# Patient Record
Sex: Female | Born: 1995
Health system: Southern US, Community
[De-identification: ages and names within clinical notes are randomized; demographics above are authoritative.]

## PROBLEM LIST (undated history)

## (undated) DIAGNOSIS — T7840XA Allergy, unspecified, initial encounter: Secondary | ICD-10-CM

## (undated) DIAGNOSIS — Z8709 Personal history of other diseases of the respiratory system: Secondary | ICD-10-CM

## (undated) HISTORY — DX: Allergy, unspecified, initial encounter: T78.40XA

## (undated) HISTORY — DX: Personal history of other diseases of the respiratory system: Z87.09

## (undated) HISTORY — PX: BUNIONECTOMY: SHX129

---
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
## 2014-12-02 ENCOUNTER — Ambulatory Visit (INDEPENDENT_AMBULATORY_CARE_PROVIDER_SITE_OTHER): Payer: BLUE CROSS/BLUE SHIELD

## 2014-12-02 ENCOUNTER — Encounter: Payer: Self-pay | Admitting: Podiatry

## 2014-12-02 ENCOUNTER — Ambulatory Visit (INDEPENDENT_AMBULATORY_CARE_PROVIDER_SITE_OTHER): Payer: BLUE CROSS/BLUE SHIELD | Admitting: Podiatry

## 2014-12-02 VITALS — BP 98/56 | HR 84 | Resp 16 | Ht 63.5 in | Wt 148.0 lb

## 2014-12-02 DIAGNOSIS — M2580 Other specified joint disorders, unspecified joint: Secondary | ICD-10-CM | POA: Diagnosis not present

## 2014-12-02 DIAGNOSIS — M79672 Pain in left foot: Secondary | ICD-10-CM

## 2014-12-02 MED ORDER — METHYLPREDNISOLONE 4 MG PO TBPK: ORAL_TABLET | ORAL | Status: DC

## 2014-12-02 NOTE — Patient Instructions (Signed)
Sesamoid Injury  Sesamoid bones are bones that are completely enclosed by a tendon. The most recognizable sesamoid bone is the kneecap (patella). Your body also has sesamoid bones in the hands and feet. Sesamoid bones of the feet are more commonly injured than those of the hand. Sesamoid bones in the feet may be injured because of the force placed on them while standing, walking, running, or jumping. Sesamoid injuries include:   Inflammation of the sesamoid (sesamoiditis).  Fracture.  Stress fracture. The sesamoid bone on the base of the big toe is especially susceptible. SYMPTOMS   Pain with weight bearing on the foot, such as with standing, walking, running, jumping, or dancing.  Pain with trying to lift the big toe.  Tenderness and swelling under the base of the big toe. CAUSES  A sesamoid injury is typically caused by acute trauma or overuse trauma to the foot. This may include jumping and landing on the ball of the foot or jumping or dancing on the balls of the feet. Other causes include:  Interrupted blood supply (avascular necrosis).  Infection. RISK INCREASES WITH:  Sports that require jumping from a great height or repeated jumping or standing on the balls of the feet. These include:  Basketball.  Ballet.  Jogging.  Long-distance running.  Shoes that are too small or have very high heels.  Large or poorly shaped sesamoid bone.  Bunions. PREVENTION  Warm up and stretch properly before activity.  Maintain appropriate conditioning:  Ankle and leg flexibility.  Muscle strength and endurance.  Learn and use proper technique and have a coach correct improper technique.  Wear taping, protective strapping, bracing, or padding.  Wear shoes that are the proper size and ensure correct fit. PROGNOSIS If detected early and treated properly, sesamoid injuries are usually curable within 4 to 6 months.  RELATED COMPLICATIONS   Prolonged healing time if not  appropriately treated or if not given enough time to heal.  Fracture does not heal (nonunion).  Prolonged disability.  Frequent recurrence of symptoms. Appropriately addressing the problem with rehabilitation decreases frequency of recurrence and optimizes healing time.  Arthritis of the joint between the sesamoid and the rest of the big toe.  Complications of surgery, including infection, bleeding, injury to nerves, continued pain, bunion or reverse bunion formation, toe weakness, and toe hyperextension. TREATMENT Treatment initially involves the use of ice and medication to reduce pain and inflammation. It may be recommended for you to modify your activities, so they do not cause an increase in the severity of symptoms. Depending on the severity of the injury, you may be required to use crutches in order to keep weight off of the injury. Padding, bracing, or taping the area may help reduce pain. Casting of the leg and foot, a walking boot, or a stiff-soled shoe (with or without an arch support) may also be helpful. For cases of chronic sesamoid symptoms, the use of physical therapy may be recommended. On occasion, corticosteroid injections are given to reduce inflammation. It is uncommon, but possible, for surgery to be necessary to remove the sesamoid bone. MEDICATION   If pain medication is necessary, nonsteroidal anti-inflammatory medications such as aspirin and ibuprofen or other minor pain relievers such as acetaminophen are often recommended.  Do not take pain medication for 7 days before surgery.  Prescription pain relievers are usually only prescribed after surgery. Use only as directed and only as much as you need.  Corticosteroid injections may be given to reduce inflammation. However, these  injections may only be given a certain number of times. HEAT AND COLD Cold treatment (icing) relieves pain and reduces inflammation. Cold treatment should be applied for 10 to 15 minutes every  2 to 3 hours for inflammation and pain and immediately after any activity that aggravates your symptoms. Use ice packs or massage the area with a piece of ice (ice massage). SEEK MEDICAL CARE IF:   Symptoms get worse or do not improve in 6 weeks despite treatment.  Any signs of infection develop, including fever, headaches, muscular aches and weakness, fatigue, redness, warmth, or increased swelling or pain.  Any of the following occur after surgery:  You experience pain, numbness, or coldness in the foot and ankle.  Blue, gray, or dark color appears in the toenails.  Signs of infection develop, including fever, increased pain, swelling, redness, drainage, or bleeding in the surgical area.  New, unexplained symptoms develop (drugs used in treatment may produce side effects including bleeding, stomach upset, and allergic reactions). Document Released: 04/12/2005 Document Revised: 07/05/2011 Document Reviewed: 07/25/2008 [facility] Patient Information 2015 [place], [place]. This information is not intended to replace advice given to you by your health care provider. Make sure you discuss any questions you have with your health care provider.

## 2014-12-02 NOTE — Progress Notes (Signed)
   Subjective:    Patient ID: Caitlin Roberts, female    DOB: [birth year], 19 y.o.   MRN: [id number]  HPI Comments: "I am having some pain on the foot my surgery was done on"  Patient c/o aching, cramping sub 1st MPJ left for several days. The area gets red and swollen. She has had previous bunion surgery. She stands a lot at work. She has tried soaking in epsom salt water-temp relieves pain.  Foot Pain   she attends [employer] pursuing a major in physical therapy.    Review of Systems  All other systems reviewed and are negative.      Objective:   Physical Exam: I have reviewed her past medical history medications allergies surgery social history review of systems and chief complaint with statement. Pulses are strongly palpable bilateral. Neurologic sensorium is intact per Semmes-Weinstein monofilament. The tendon reflexes are intact bilateral muscle strength +5 over 5 dorsiflexion plantar flexors and inverters everters all into the musculature is intact. Orthopedic evaluation demonstrates all joints distal ankle range of motion with exception of the first metatarsal phalangeal joint of the left foot. The toe has very good dorsiflexion however can only reach neutral for its plantar flexion. It has 0 of plantarflexion. She has pain on palpation of her sesamoids. Radiograph demonstrates a clear joint with a single K wire status post first metatarsal osteotomy.        Assessment & Plan:  Assessment: Sesamoiditis capsulitis status post bunion repair left foot.  Plan: We discussed surgical intervention including relieving the restrictions on the sesamoids. We also discussed removing the K wire. At this point we injected with Kenalog and local anesthesia to the point of maximal tenderness plantarly and we discussed the possible need for orthotics to help offload her first metatarsal. I will follow-up with her 1 month I also placed her on methylprednisolone and I will follow-up with her with  questions or concerns.

## 2014-12-05 ENCOUNTER — Telehealth: Payer: Self-pay | Admitting: Family Medicine

## 2014-12-05 NOTE — Telephone Encounter (Signed)
If she has Family Planning Medicaid only then she can only have a CPE with discussion regarding birthcontrol with me and no other issues can be addressed. I will do a CPE visit on the first visit for this reason. Thank you.

## 2014-12-05 NOTE — Telephone Encounter (Signed)
This is a patient who use to see Dr Ancil Boozer and transferred earlier this year and was last seen on 12-07-13. She is now establishing care with you at the end of the month. The mother was wanting her to be seen for a physical. I informed the mother that you do not do physicals on the first visit. She informed me that her daughter was healthy and all she needed was a physical. She took the appointment but when i ran her insurance card it does not have another doctor office on it, but it does say that she has family planning. Are you willing to see her for just a physical on that day or keep her in for a establish care appointment.

## 2014-12-06 NOTE — Telephone Encounter (Signed)
Thank you patient was informed and appointment was made for annual CPE/Est care.

## 2014-12-24 ENCOUNTER — Ambulatory Visit (INDEPENDENT_AMBULATORY_CARE_PROVIDER_SITE_OTHER): Payer: BLUE CROSS/BLUE SHIELD | Admitting: Family Medicine

## 2014-12-24 ENCOUNTER — Ambulatory Visit: Payer: Self-pay | Admitting: Family Medicine

## 2014-12-24 ENCOUNTER — Encounter: Payer: Self-pay | Admitting: Family Medicine

## 2014-12-24 VITALS — BP 102/60 | HR 99 | Temp 99.0°F | Resp 18 | Wt 145.4 lb

## 2014-12-24 DIAGNOSIS — L989 Disorder of the skin and subcutaneous tissue, unspecified: Secondary | ICD-10-CM | POA: Insufficient documentation

## 2014-12-24 DIAGNOSIS — Z0000 Encounter for general adult medical examination without abnormal findings: Secondary | ICD-10-CM | POA: Insufficient documentation

## 2014-12-24 DIAGNOSIS — H029 Unspecified disorder of eyelid: Secondary | ICD-10-CM | POA: Insufficient documentation

## 2014-12-24 DIAGNOSIS — Z2821 Immunization not carried out because of patient refusal: Secondary | ICD-10-CM | POA: Insufficient documentation

## 2014-12-24 DIAGNOSIS — G4489 Other headache syndrome: Secondary | ICD-10-CM | POA: Insufficient documentation

## 2014-12-24 DIAGNOSIS — G44009 Cluster headache syndrome, unspecified, not intractable: Secondary | ICD-10-CM

## 2014-12-24 DIAGNOSIS — M21612 Bunion of left foot: Secondary | ICD-10-CM | POA: Insufficient documentation

## 2014-12-24 DIAGNOSIS — M21611 Bunion of right foot: Secondary | ICD-10-CM | POA: Insufficient documentation

## 2014-12-24 MED ORDER — IBUPROFEN 800 MG PO TABS: 800.0000 mg | ORAL_TABLET | Freq: Three times a day (TID) | ORAL | Status: DC | PRN

## 2014-12-24 MED ORDER — FLUTICASONE PROPIONATE 50 MCG/ACT NA SUSP: 2.0000 | Freq: Every day | NASAL | Status: DC

## 2014-12-24 NOTE — Patient Instructions (Signed)
Sinus Headache °A sinus headache is when your sinuses become clogged or swollen. Sinus headaches can range from mild to severe.  °CAUSES °A sinus headache can have different causes, such as: °· Colds. °· Sinus infections. °· Allergies. °SYMPTOMS  °Symptoms of a sinus headache may vary and can include: °· Headache. °· Pain or pressure in the face. °· Congested or runny nose. °· Fever. °· Inability to smell. °· Pain in upper teeth. °Weather changes can make symptoms worse. °TREATMENT  °The treatment of a sinus headache depends on the cause. °· Sinus pain caused by a sinus infection may be treated with antibiotic medicine. °· Sinus pain caused by allergies may be helped by allergy medicines (antihistamines) and medicated nasal sprays. °· Sinus pain caused by congestion may be helped by flushing the nose and sinuses with saline solution. °HOME CARE INSTRUCTIONS  °· If antibiotics are prescribed, take them as directed. Finish them even if you start to feel better. °· Only take over-the-counter or prescription medicines for pain, discomfort, or fever as directed by your caregiver. °· If you have congestion, use a nasal spray to help reduce pressure. °SEEK IMMEDIATE MEDICAL CARE IF: °· You have a fever. °· You have headaches more than once a week. °· You have sensitivity to light or sound. °· You have repeated nausea and vomiting. °· You have vision problems. °· You have sudden, severe pain in your face or head. °· You have a seizure. °· You are confused. °· Your sinus headaches do not get better after treatment. Many people think they have a sinus headache when they actually have migraines or tension headaches. °MAKE SURE YOU:  °· Understand these instructions. °· Will watch your condition. °· Will get help right away if you are not doing well or get worse. °Document Released: 05/20/2004 Document Revised: 07/05/2011 Document Reviewed: 07/11/2010 °[employer]® Patient Information ©2015 [employer]. This information is not  intended to replace advice given to you by your health care provider. Make sure you discuss any questions you have with your health care provider. ° °

## 2014-12-24 NOTE — Progress Notes (Addendum)
Name: Caitlin Roberts   MRN: [id number]    DOB: February 20, 1996   Date:12/24/2014       Progress Note  Subjective  Chief Complaint  Chief Complaint  Patient presents with  . Establish Care  . Annual Exam  . Headache  . Skin Problem    HPI  Patient is here today to Establish Care for a Complete Female Physical Exam:  The patient has has no unusual complaints and complains of headache associated with sinus congestion that occurs intermitently. Headhaches are located over frontal and temporal areas and are described as tight and pounding. Relieved with NSAIDs and sleep mostly.  Not associated with nausea, visual changes, auras.  She has tried [employer] but did not get much symptomatic relief. Overall feels healthy. Diet is well balanced. In general does exercise regularly. Sees dentist regularly and addresses vision concerns with ophthalmologist if applicable.  She is also complaining of a left lower eye lid skin lesion that is irritating and growing larger. Vision not affected. In regards to sexual activity the patient is not currently currently sexually active. Currently is not concerned about exposure to any STDs. Menses regular monthly about 28-32 day cycles. Was on OCPs previously but not on any birth control at this time as she is not sexually active.  Recently she had consulted with podiatrist regarding repeat bunionectomy but is deciding against another surgery.  Lalana also has questions about how to avoid razor bumps in bikini line after shaving.    Past Medical History  Diagnosis Date  . History of sinusitis   . Allergy     Past Surgical History  Procedure Laterality Date  . Bunionectomy Left     about 3yrs ago    Family History  Problem Relation Age of Onset  . Cancer Father     colon  . Kidney Stones Father     Social History   Social History  . Marital Status: Single    Spouse Name: N/A  . Number of Children: N/A  . Years of Education: N/A   Occupational  History  . Not on file.   Social History Main Topics  . Smoking status: Never Smoker   . Smokeless tobacco: Not on file  . Alcohol Use: No  . Drug Use: Not on file  . Sexual Activity: No   Other Topics Concern  . Not on file   Social History Narrative     Current outpatient prescriptions:  .  fluticasone (FLONASE) 50 MCG/ACT nasal spray, Place 2 sprays into both nostrils daily., Disp: 16 g, Rfl: 1 .  ibuprofen (ADVIL,MOTRIN) 800 MG tablet, Take 1 tablet (800 mg total) by mouth every 8 (eight) hours as needed., Disp: 30 tablet, Rfl: 1 .  methylPREDNISolone (MEDROL) 4 MG TBPK tablet, Tapering 6 day dose pack (Patient not taking: Reported on 12/24/2014), Disp: 21 tablet, Rfl: 0  No Known Allergies  ROS  CONSTITUTIONAL: No significant weight changes, fever, chills, weakness or fatigue.  HEENT:  - Eyes: No visual changes.  - Ears: No auditory changes. No pain.  - Nose: No sneezing, congestion, runny nose. - Throat: No sore throat. No changes in swallowing. SKIN: No rash or itching.  CARDIOVASCULAR: No chest pain, chest pressure or chest discomfort. No palpitations or edema.  RESPIRATORY: No shortness of breath, cough or sputum.  GASTROINTESTINAL: No anorexia, nausea, vomiting. No changes in bowel habits. No abdominal pain or blood.  GENITOURINARY: No dysuria. No frequency. No discharge.  NEUROLOGICAL: No  headache, dizziness, syncope, paralysis, ataxia, numbness or tingling in the extremities. No memory changes. No change in bowel or bladder control.  MUSCULOSKELETAL: Foot joint pain. No muscle pain. HEMATOLOGIC: No anemia, bleeding or bruising.  LYMPHATICS: No enlarged lymph nodes.  PSYCHIATRIC: No change in mood. No change in sleep pattern.  ENDOCRINOLOGIC: No reports of sweating, cold or heat intolerance. No polyuria or polydipsia.   Objective  Filed Vitals:   12/24/14 1137  BP: 102/60  Pulse: 99  Temp: 99 F (37.2 C)  TempSrc: Oral  Resp: 18  Weight: 145 lb 6.4 oz  (65.953 kg)  SpO2: 97%   Body mass index is 25.35 kg/(m^2).  Depression screen PHQ 2/9 12/24/2014  Decreased Interest 0  Down, Depressed, Hopeless 0  PHQ - 2 Score 0     Physical Exam  Constitutional: Patient appears well-developed and well-nourished. In no distress.  HEENT:  - Face: Mild scattered acne. Left lower eye lid with raised flesh colored lesion about 0.49mm in diameter. - Head: Normocephalic and atraumatic.  - Ears: Bilateral TMs gray, no erythema or effusion - Nose: Nasal mucosa moist - Mouth/Throat: Oropharynx is clear and moist. No tonsillar hypertrophy or erythema. No post nasal drainage.  - Eyes: Conjunctivae clear, EOM movements normal. PERRLA. No scleral icterus.  Neck: Normal range of motion. Neck supple. No JVD present. No thyromegaly present.  Cardiovascular: Normal rate, regular rhythm and normal heart sounds.  No murmur heard.  Pulmonary/Chest: Effort normal and breath sounds normal. No respiratory distress. Abdominal: Soft. Bowel sounds are normal, no distension. There is no tenderness. no masses BREAST: Bilateral breast exam normal with no masses, skin changes or nipple discharge FEMALE GENITALIA: Deferred  RECTAL: Deferred  Musculoskeletal: Normal range of motion bilateral UE and LE, no joint effusions. Peripheral vascular: Bilateral LE no edema. Neurological: CN II-XII grossly intact with no focal deficits. Alert and oriented to person, place, and time. Coordination, balance, strength, speech and gait are normal.  Skin: Skin is warm and dry. No rash noted. No erythema.  Psychiatric: Patient has a normal mood and affect. Behavior is normal in office today. Judgment and thought content normal in office today.   Assessment & Plan  1. Annual physical exam Healthy female. Discussed upcoming preventative measures.  2. Lesion of lower eyelid Likely benign skin tag.  - Ambulatory referral to Dermatology  3. Allergic headache  - fluticasone (FLONASE) 50  MCG/ACT nasal spray; Place 2 sprays into both nostrils daily.  Dispense: 16 g; Refill: 1 - ibuprofen (ADVIL,MOTRIN) 800 MG tablet; Take 1 tablet (800 mg total) by mouth every 8 (eight) hours as needed.  Dispense: 30 tablet; Refill: 1  4. Bumps on skin Avoid razor burn by applying witch hazel solution,  mild rubbing alcohol solution or OTC acne cream directly on the area immediately after shaving.  5. Influenza vaccination declined by patient

## 2015-01-01 ENCOUNTER — Encounter: Payer: BLUE CROSS/BLUE SHIELD | Admitting: Podiatry

## 2015-01-01 NOTE — Progress Notes (Signed)
This encounter was created in error - please disregard.

## 2015-08-12 ENCOUNTER — Ambulatory Visit (INDEPENDENT_AMBULATORY_CARE_PROVIDER_SITE_OTHER): Payer: BLUE CROSS/BLUE SHIELD

## 2015-08-12 ENCOUNTER — Ambulatory Visit (INDEPENDENT_AMBULATORY_CARE_PROVIDER_SITE_OTHER): Payer: BLUE CROSS/BLUE SHIELD | Admitting: Podiatry

## 2015-08-12 ENCOUNTER — Encounter: Payer: Self-pay | Admitting: Podiatry

## 2015-08-12 VITALS — BP 92/64 | HR 71 | Resp 16

## 2015-08-12 DIAGNOSIS — M79672 Pain in left foot: Secondary | ICD-10-CM

## 2015-08-12 DIAGNOSIS — T847XXD Infection and inflammatory reaction due to other internal orthopedic prosthetic devices, implants and grafts, subsequent encounter: Secondary | ICD-10-CM

## 2015-08-12 DIAGNOSIS — M2580 Other specified joint disorders, unspecified joint: Secondary | ICD-10-CM

## 2015-08-12 NOTE — Patient Instructions (Signed)
Pre-Operative Instructions  Congratulations, you have decided to take an important step to improving your quality of life.  You can be assured that the doctors of [facility] will be with you every step of the way.  1. Plan to be at the surgery center/hospital at least 1 (one) hour prior to your scheduled time unless otherwise directed by the surgical center/hospital staff.  You must have a responsible adult accompany you, remain during the surgery and drive you home.  Make sure you have directions to the surgical center/hospital and know how to get there on time. 2. For hospital based surgery you will need to obtain a history and physical form from your family physician within 1 month prior to the date of surgery- we will give you a form for you primary physician.  3. We make every effort to accommodate the date you request for surgery.  There are however, times where surgery dates or times have to be moved.  We will contact you as soon as possible if a change in schedule is required.   4. No Aspirin/Ibuprofen for one week before surgery.  If you are on aspirin, any non-steroidal anti-inflammatory medications (Mobic, Aleve, Ibuprofen) you should stop taking it 7 days prior to your surgery.  You make take Tylenol  For pain prior to surgery.  5. Medications- If you are taking daily heart and blood pressure medications, seizure, reflux, allergy, asthma, anxiety, pain or diabetes medications, make sure the surgery center/hospital is aware before the day of surgery so they may notify you which medications to take or avoid the day of surgery. 6. No food or drink after midnight the night before surgery unless directed otherwise by surgical center/hospital staff. 7. No alcoholic beverages 24 hours prior to surgery.  No smoking 24 hours prior to or 24 hours after surgery. 8. Wear loose pants or shorts- loose enough to fit over bandages, boots, and casts. 9. No slip on shoes, sneakers are best. 10. Bring  your boot with you to the surgery center/hospital.  Also bring crutches or a walker if your physician has prescribed it for you.  If you do not have this equipment, it will be provided for you after surgery. 11. If you have not been contracted by the surgery center/hospital by the day before your surgery, call to confirm the date and time of your surgery. 12. Leave-time from work may vary depending on the type of surgery you have.  Appropriate arrangements should be made prior to surgery with your employer. 13. Prescriptions will be provided immediately following surgery by your doctor.  Have these filled as soon as possible after surgery and take the medication as directed. 14. Remove nail polish on the operative foot. 15. Wash the night before surgery.  The night before surgery wash the foot and leg well with the antibacterial soap provided and water paying special attention to beneath the toenails and in between the toes.  Rinse thoroughly with water and dry well with a towel.  Perform this wash unless told not to do so by your physician.  Enclosed: 1 Ice pack (please put in freezer the night before surgery)   1 Hibiclens skin cleaner   Pre-op Instructions  If you have any questions regarding the instructions, do not hesitate to call our office.  [place]: [address] [place], [place] [address] [phone]  [place]: [address] , [place] [address] [phone]  [place]: [address]  [place],  [address] [phone]  Dr. Richard   Tuchman DPM, Dr. Norman Regal DPM Dr. Richard Sikora DPM, Dr. M. Todd Hyatt DPM, Dr. Kathryn Egerton DPM 

## 2015-08-12 NOTE — Progress Notes (Signed)
She presents today with a chief complaint of pain to the plantar aspect of the first metatarsophalangeal joint of the left foot. She states that occasionally becomes very sensitive across the top of the foot is well she states that the foot is really never felt that after surgery and it hurts right here as she points to the tibial sesamoid.  Objective: Vital signs are stable alert and oriented 3. Pulses are strongly palpable. She has pain and clicking on range of motion of the first metatarsophalangeal joint left status post bunion repair. Radiographs confirm a single K wire placed that possibly interferes with sesamoid range of motion. Radiograph also demonstrate what appears to be a possible fracture of the tibial sesamoid.  Assessment: Sesamoiditis with painful internal fixation left foot.  Plan: At this point our plan is to remove the K wire from the first metatarsal left foot. If this does not resolve the problem then we will consider at that time an MRI of the tibial sesamoid to evaluate for fracture. At that point a second surgery possibly removal of the sesamoid. We consented her today for removal of single K wire first metatarsal left foot. I will follow-up with her in the near future for surgery.

## 2015-08-14 ENCOUNTER — Telehealth: Payer: Self-pay | Admitting: *Deleted

## 2015-08-14 NOTE — Telephone Encounter (Signed)
"  You can call me."

## 2015-08-14 NOTE — Telephone Encounter (Signed)
I left a message for patient to give me a call on Monday.  Dr. Stephenie Acres next available is May 19 or 26.

## 2015-08-18 NOTE — Telephone Encounter (Signed)
"  Can we do surgery on May 26 if it's still available.  Call me back."

## 2015-08-20 NOTE — Telephone Encounter (Signed)
"  I'm just calling back to see when I can schedule an appointment.  Call me back."

## 2015-08-21 NOTE — Telephone Encounter (Signed)
"  I'm calling to see if I can schedule surgery on May 26.  Is it still available?"  I left you a message, he can do it on May 26th.  "I didn't get it.  What time will I need to be there?"  Someone from the surgical center will call you with the arrival time a day or two prior with the arrival time.  You will need to register with the surgical center, instructions are in your brochure from surgical center.

## 2015-09-18 ENCOUNTER — Other Ambulatory Visit: Payer: Self-pay | Admitting: Podiatry

## 2015-09-18 MED ORDER — OXYCODONE-ACETAMINOPHEN 10-325 MG PO TABS: 1.0000 | ORAL_TABLET | Freq: Four times a day (QID) | ORAL | Status: DC | PRN

## 2015-09-18 MED ORDER — PROMETHAZINE HCL 25 MG PO TABS: 25.0000 mg | ORAL_TABLET | Freq: Three times a day (TID) | ORAL | Status: DC | PRN

## 2015-09-18 MED ORDER — CLINDAMYCIN HCL 150 MG PO CAPS: 150.0000 mg | ORAL_CAPSULE | Freq: Three times a day (TID) | ORAL | Status: DC

## 2015-09-19 ENCOUNTER — Encounter: Payer: Self-pay | Admitting: Podiatry

## 2015-09-19 DIAGNOSIS — Z4889 Encounter for other specified surgical aftercare: Secondary | ICD-10-CM

## 2015-09-24 ENCOUNTER — Telehealth: Payer: Self-pay | Admitting: *Deleted

## 2015-09-24 NOTE — Telephone Encounter (Signed)
Post op courtesy call-Left message reminding pt of her 09/25/2015 appt at 145pm, and not to be up on or dangling the foot more than 15 mins/hour, stay in the surgical shoe at all times and leave the original surgical dressing in place until the 1st POV, and call with concerns.

## 2015-09-25 ENCOUNTER — Encounter: Payer: Self-pay | Admitting: Podiatry

## 2015-09-25 ENCOUNTER — Ambulatory Visit (INDEPENDENT_AMBULATORY_CARE_PROVIDER_SITE_OTHER): Payer: BLUE CROSS/BLUE SHIELD

## 2015-09-25 ENCOUNTER — Ambulatory Visit (INDEPENDENT_AMBULATORY_CARE_PROVIDER_SITE_OTHER): Payer: BLUE CROSS/BLUE SHIELD | Admitting: Podiatry

## 2015-09-25 VITALS — BP 103/63 | HR 93 | Resp 16

## 2015-09-25 DIAGNOSIS — Z9889 Other specified postprocedural states: Secondary | ICD-10-CM | POA: Diagnosis not present

## 2015-09-25 DIAGNOSIS — T847XXD Infection and inflammatory reaction due to other internal orthopedic prosthetic devices, implants and grafts, subsequent encounter: Secondary | ICD-10-CM

## 2015-09-25 NOTE — Progress Notes (Signed)
She presents today 1 week status post removal of pins first metatarsal of the left foot with release of the first metatarsophalangeal joint. She states it is still sore but seems to be moving pretty well.  Objective: Vital signs are stable she is alert and oriented 3. Pulses are palpable area neurologic sensorium is intact. Dry sterile dressing was removed demonstrates no erythema on edema and a saline as drainage or odor. Sutures appear to be intact.  Assessment: Well-healing surgical foot.  Plan: Redressed today dresser compressive dressing encouraged her to keep this elevated stay off of it as much as possible follow-up with me in 1 week.

## 2015-10-02 ENCOUNTER — Encounter: Payer: Self-pay | Admitting: Podiatry

## 2015-10-02 ENCOUNTER — Ambulatory Visit (INDEPENDENT_AMBULATORY_CARE_PROVIDER_SITE_OTHER): Payer: BLUE CROSS/BLUE SHIELD | Admitting: Podiatry

## 2015-10-02 VITALS — BP 106/69 | HR 76 | Resp 12

## 2015-10-02 DIAGNOSIS — T847XXD Infection and inflammatory reaction due to other internal orthopedic prosthetic devices, implants and grafts, subsequent encounter: Secondary | ICD-10-CM | POA: Diagnosis not present

## 2015-10-02 DIAGNOSIS — Z9889 Other specified postprocedural states: Secondary | ICD-10-CM

## 2015-10-02 NOTE — Progress Notes (Signed)
She presents today 2 weeks status post removal internal fixation with release of the first metatarsophalangeal joint left. She states that she's been moving her toe is somewhat stiff but it seems to be doing better than it was.  Objective: Vital signs are stable she is alert and oriented 3. Pulses are palpable. She has good range of motion of the first metatarsophalangeal joint all the sutures were removed.  Assessment: Well-healing surgical foot left.  Plan: Encouraged range of motion exercises and at-home physical therapy should this continue to stiffen up she's noted modified me immediately so I can send her to physical therapy. I will allow her to get back to her regular routine notify me with questions or concerns.

## 2015-10-29 ENCOUNTER — Ambulatory Visit (HOSPITAL_COMMUNITY)
Admission: EM | Admit: 2015-10-29 | Discharge: 2015-10-29 | Disposition: A | Discharge status: Home / Self Care | Payer: BLUE CROSS/BLUE SHIELD | Attending: Emergency Medicine | Admitting: Emergency Medicine

## 2015-10-29 ENCOUNTER — Encounter (HOSPITAL_COMMUNITY): Payer: Self-pay | Admitting: Family Medicine

## 2015-10-29 DIAGNOSIS — L509 Urticaria, unspecified: Secondary | ICD-10-CM

## 2015-10-29 DIAGNOSIS — T7840XA Allergy, unspecified, initial encounter: Secondary | ICD-10-CM | POA: Diagnosis not present

## 2015-10-29 MED ORDER — PREDNISONE 20 MG PO TABS: ORAL_TABLET | ORAL | Status: DC

## 2015-10-29 NOTE — ED Notes (Signed)
Pt here for rash to face, neck and chest that started yesterday. Pt does have some food allergies and sts also recent laundry detergent change. sts she has been taking benadryl with some relief.

## 2015-10-29 NOTE — ED Provider Notes (Signed)
CSN: [id number]     Arrival date & time 10/29/15  1233 History   First MD Initiated Contact with Patient 10/29/15 1420     Chief Complaint  Patient presents with  . Allergic Reaction   (Consider location/radiation/quality/duration/timing/severity/associated sxs/prior Treatment) HPI Comments: 20 year old female who experienced an allergic reaction manifested as urticaria yesterday. She was having facial erythema as well as urticaria to the back and torso. These were pruritic. Yesterday it was more severe she took some Benadryl and this mitigated the itching and much of the rash ameliorated. Today there a few reminiscent areas of urticaria. Today right after eating at a [employer] she developed some mild swelling and itchy urticaria around the mouth and face. She can take tics and Benadryl and this too has primarily resolved. Denies problems with breathing, shortness of breath, coughing, swelling, intraoral edema or erythema.  Patient is a 20 y.o. female presenting with allergic reaction.  Allergic Reaction Presenting symptoms: rash     Past Medical History  Diagnosis Date  . History of sinusitis   . Allergy    Past Surgical History  Procedure Laterality Date  . Bunionectomy Left     about 26yrs ago   Family History  Problem Relation Age of Onset  . Cancer Father     colon  . Kidney Stones Father    Social History  Substance Use Topics  . Smoking status: Never Smoker   . Smokeless tobacco: None  . Alcohol Use: No   OB History    No data available     Review of Systems  Constitutional: Negative for fever, activity change and fatigue.  HENT: Negative.   Eyes: Negative.   Respiratory: Negative for cough, choking and shortness of breath.   Cardiovascular: Negative for chest pain.  Gastrointestinal: Negative.   Musculoskeletal: Negative.   Skin: Positive for rash.  Neurological: Negative.   All other systems reviewed and are negative.   Allergies  Review of  patient's allergies indicates no known allergies.  Home Medications   Prior to Admission medications   Medication Sig Start Date End Date Taking? Authorizing Provider  fluticasone (FLONASE) 50 MCG/ACT nasal spray Place 2 sprays into both nostrils daily. 12/24/14   Bobetta Lime, MD  ibuprofen (ADVIL,MOTRIN) 800 MG tablet Take 1 tablet (800 mg total) by mouth every 8 (eight) hours as needed. 12/24/14   Bobetta Lime, MD  oxyCODONE-acetaminophen (PERCOCET) 10-325 MG tablet Take 1 tablet by mouth every 6 (six) hours as needed for pain. 09/18/15   Max T Hyatt, DPM  predniSONE (DELTASONE) 20 MG tablet Take 3 tabs po on first day, 2 tabs second day, 2 tabs third day, 1 tab fourth day, 1 tab 5th day. Take with food. 10/29/15   Janne Napoleon, NP  promethazine (PHENERGAN) 25 MG tablet Take 1 tablet (25 mg total) by mouth every 8 (eight) hours as needed for nausea or vomiting. 09/18/15   Max Villa Herb, DPM   Meds Ordered and Administered this Visit  Medications - No data to display  BP 114/76 mmHg  Pulse 85  Temp(Src) 98.5 F (36.9 C) (Oral)  Resp 12  SpO2 100% No data found.   Physical Exam  Constitutional: She is oriented to person, place, and time. She appears well-developed and well-nourished. No distress.  HENT:  Head: Normocephalic and atraumatic.  Mouth/Throat: Oropharynx is clear and moist. No oropharyngeal exudate.  No intraoral edema, erythema, swelling. No voice change.  Eyes: EOM are normal.  Neck: Normal range of  motion. Neck supple.  Cardiovascular: Normal rate and normal heart sounds.   Pulmonary/Chest: Effort normal and breath sounds normal. No respiratory distress. She has no wheezes.  Musculoskeletal: She exhibits no edema.  Neurological: She is alert and oriented to person, place, and time. She exhibits normal muscle tone.  Skin: Skin is warm and dry.  Psychiatric: She has a normal mood and affect.  Nursing note and vitals reviewed.   ED Course  Procedures (including  critical care time)  Labs Review Labs Reviewed - No data to display  Imaging Review No results found.   Visual Acuity Review  Right Eye Distance:   Left Eye Distance:   Bilateral Distance:    Right Eye Near:   Left Eye Near:    Bilateral Near:         MDM   1. Urticaria   2. Allergic reaction, initial encounter    Hives Take the prednisone as directed. Also take with food. Recommend taking a nondrowsy antihistamine such as Zyrtec or Allegra. For more serious reactions with intense itching, hives to most body surface areas or swelling restart the Benadryl. Recommend seeing her PCP. You may need to have allergy testing again. Meds ordered this encounter  Medications  . predniSONE (DELTASONE) 20 MG tablet    Sig: Take 3 tabs po on first day, 2 tabs second day, 2 tabs third day, 1 tab fourth day, 1 tab 5th day. Take with food.    Dispense:  9 tablet    Refill:  0    Order Specific Question:  Supervising Provider    Answer:  Carmela Hurt       Janne Napoleon, NP 10/29/15 1442

## 2015-10-29 NOTE — Discharge Instructions (Signed)
Allergies An allergy is when your body reacts to a substance in a way that is not normal. An allergic reaction can happen after you:  Eat something.  Breathe in something.  Touch something. WHAT KINDS OF ALLERGIES ARE THERE? You can be allergic to:  Things that are only around during certain seasons, like molds and pollens.  Foods.  Drugs.  Insects.  Animal dander. WHAT ARE SYMPTOMS OF ALLERGIES?  Puffiness (swelling). This may happen on the lips, face, tongue, mouth, or throat.  Sneezing.  Coughing.  Breathing loudly (wheezing).  Stuffy nose.  Tingling in the mouth.  A rash.  Itching.  Itchy, red, puffy areas of skin (hives).  Watery eyes.  Throwing up (vomiting).  Watery poop (diarrhea).  Dizziness.  Feeling faint or fainting.  Trouble breathing or swallowing.  A tight feeling in the chest.  A fast heartbeat. HOW ARE ALLERGIES DIAGNOSED? Allergies can be diagnosed with:  A medical and family history.  Skin tests.  Blood tests.  A food diary. A food diary is a record of all the foods, drinks, and symptoms you have each day.  The results of an elimination diet. This diet involves making sure not to eat certain foods and then seeing what happens when you start eating them again. HOW ARE ALLERGIES TREATED? There is no cure for allergies, but allergic reactions can be treated with medicine. Severe reactions usually need to be treated at a hospital.  HOW CAN REACTIONS BE PREVENTED? The best way to prevent an allergic reaction is to avoid the thing you are allergic to. Allergy shots and medicines can also help prevent reactions in some cases.   This information is not intended to replace advice given to you by your health care provider. Make sure you discuss any questions you have with your health care provider.   Document Released: 08/07/2012 Document Revised: 05/03/2014 Document Reviewed: 01/22/2014 Elsevier Interactive Patient Education 2016  [place] Take the prednisone as directed. Also take with food. Recommend taking a nondrowsy antihistamine such as Zyrtec or Allegra. For more serious reactions with intense itching, hives almost body surface areas or swelling restart the Benadryl. Recommend seeing her PCP. You may need to have allergy testing again. Hives are itchy, red, puffy (swollen) areas of the skin. Hives can change in size and location on your body. Hives can come and go for hours, days, or weeks. Hives do not spread from person to person (noncontagious). Scratching, exercise, and stress can make your hives worse. HOME CARE  Avoid things that cause your hives (triggers).  Take antihistamine medicines as told by your doctor. Do not drive while taking an antihistamine.  Take any other medicines for itching as told by your doctor.  Wear loose-fitting clothing.  Keep all doctor visits as told. GET HELP RIGHT AWAY IF:   You have a fever.  Your tongue or lips are puffy.  You have trouble breathing or swallowing.  You feel tightness in the throat or chest.  You have belly (abdominal) pain.  You have lasting or severe itching that is not helped by medicine.  You have painful or puffy joints. These problems may be the first sign of a life-threatening allergic reaction. Call your local emergency services (911 in [place] MAKE SURE YOU:   Understand these instructions.  Will watch your condition.  Will get help right away if you are not doing well or get worse.   This information is not intended to replace advice given to  you by your health care provider. Make sure you discuss any questions you have with your health care provider.   Document Released: 01/20/2008 Document Revised: 10/12/2011 Document Reviewed: 07/06/2011 Elsevier Interactive Patient Education [employer].

## 2015-11-07 ENCOUNTER — Other Ambulatory Visit: Payer: Self-pay

## 2015-11-07 DIAGNOSIS — G4489 Other headache syndrome: Secondary | ICD-10-CM

## 2015-11-07 MED ORDER — FLUTICASONE PROPIONATE 50 MCG/ACT NA SUSP: 2.0000 | Freq: Every day | NASAL | Status: DC

## 2015-11-07 NOTE — Telephone Encounter (Signed)
Last visit August 2016; has appt already sched for Sept; Rx approved

## 2015-12-25 NOTE — Progress Notes (Signed)
DOS 09/19/2015 Removal internal kwire fixation 1st metatarsal left foot

## 2015-12-26 ENCOUNTER — Encounter: Payer: BLUE CROSS/BLUE SHIELD | Admitting: Family Medicine

## 2016-01-20 ENCOUNTER — Encounter: Payer: Self-pay | Admitting: Family Medicine

## 2016-01-20 ENCOUNTER — Ambulatory Visit (INDEPENDENT_AMBULATORY_CARE_PROVIDER_SITE_OTHER): Payer: BLUE CROSS/BLUE SHIELD | Admitting: Family Medicine

## 2016-01-20 VITALS — BP 116/72 | HR 93 | Temp 98.1°F | Resp 14 | Wt 151.0 lb

## 2016-01-20 DIAGNOSIS — Z800 Family history of malignant neoplasm of digestive organs: Secondary | ICD-10-CM | POA: Diagnosis not present

## 2016-01-20 DIAGNOSIS — Z114 Encounter for screening for human immunodeficiency virus [HIV]: Secondary | ICD-10-CM | POA: Insufficient documentation

## 2016-01-20 DIAGNOSIS — N898 Other specified noninflammatory disorders of vagina: Secondary | ICD-10-CM | POA: Diagnosis not present

## 2016-01-20 DIAGNOSIS — Z0000 Encounter for general adult medical examination without abnormal findings: Secondary | ICD-10-CM | POA: Diagnosis not present

## 2016-01-20 DIAGNOSIS — Z124 Encounter for screening for malignant neoplasm of cervix: Secondary | ICD-10-CM | POA: Diagnosis not present

## 2016-01-20 LAB — CBC WITH DIFFERENTIAL/PLATELET
BASOS PCT: 1 %
Basophils Absolute: 80 cells/uL (ref 0–200)
EOS ABS: 160 {cells}/uL (ref 15–500)
Eosinophils Relative: 2 %
HCT: 42.3 % (ref 35.0–45.0)
Hemoglobin: 14 g/dL (ref 11.7–15.5)
LYMPHS PCT: 30 %
Lymphs Abs: 2400 cells/uL (ref 850–3900)
MCH: 29.4 pg (ref 27.0–33.0)
MCHC: 33.1 g/dL (ref 32.0–36.0)
MCV: 88.9 fL (ref 80.0–100.0)
MONOS PCT: 8 %
MPV: 10.5 fL (ref 7.5–12.5)
Monocytes Absolute: 640 cells/uL (ref 200–950)
NEUTROS ABS: 4720 {cells}/uL (ref 1500–7800)
Neutrophils Relative %: 59 %
PLATELETS: 347 10*3/uL (ref 140–400)
RBC: 4.76 MIL/uL (ref 3.80–5.10)
RDW: 13.7 % (ref 11.0–15.0)
WBC: 8 10*3/uL (ref 3.8–10.8)

## 2016-01-20 LAB — COMPLETE METABOLIC PANEL WITH GFR
ALT: 11 U/L (ref 6–29)
AST: 14 U/L (ref 10–30)
Albumin: 4.5 g/dL (ref 3.6–5.1)
Alkaline Phosphatase: 61 U/L (ref 33–115)
BILIRUBIN TOTAL: 0.4 mg/dL (ref 0.2–1.2)
BUN: 14 mg/dL (ref 7–25)
CO2: 23 mmol/L (ref 20–31)
CREATININE: 0.69 mg/dL (ref 0.50–1.10)
Calcium: 9.6 mg/dL (ref 8.6–10.2)
Chloride: 104 mmol/L (ref 98–110)
GFR, Est Non African American: >89 mL/min (ref >=60)
GLUCOSE: 85 mg/dL (ref 65–99)
Potassium: 4.1 mmol/L (ref 3.5–5.3)
SODIUM: 138 mmol/L (ref 135–146)
TOTAL PROTEIN: 6.8 g/dL (ref 6.1–8.1)

## 2016-01-20 LAB — LIPID PANEL
CHOLESTEROL: 162 mg/dL (ref 125–200)
HDL: 49 mg/dL (ref >=46)
LDL Cholesterol: 98 mg/dL (ref <130)
TRIGLYCERIDES: 73 mg/dL (ref <150)
Total CHOL/HDL Ratio: 3.3 Ratio (ref <=5.0)
VLDL: 15 mg/dL (ref <30)

## 2016-01-20 LAB — TSH: TSH: 1.43 m[IU]/L

## 2016-01-20 NOTE — Assessment & Plan Note (Signed)
Check for BV

## 2016-01-20 NOTE — Progress Notes (Signed)
Patient ID: Caitlin Roberts, female   DOB: [birth year], 20 y.o.   MRN: [id number]   Subjective:   Caitlin Roberts is a 20 y.o. female here for a complete physical exam  Interim issues since last visit: had bunionectomy left foot; hurt worse this time; more pain, Dr. Milinda Pointer did the 2nd surgery, but he did a great job she says, he had to remove scar tissue from the surgery  USPSTF grade A and B recommendations Alcohol: no Depression: Depression screen [facility] 2/9 01/20/2016 12/24/2014  Decreased Interest 0 0  Down, Depressed, Hopeless 0 0  PHQ - 2 Score 0 0    Hypertension: no Obesity: no Tobacco use: no HIV, hep B, hep C: okay for testing for hiv; will get hep B vaccinations STD testing and prevention (chl/gon/syphilis): has had discharge, checked for gonorrhea and chlamydia, checked for UTI Lipids: check today Glucose: check today Colorectal cancer: father had colon cancer and he is a survivor, early 2s Breast cancer: no fam hx BRCA gene screening: no fam hx Intimate partner violence: no Cervical cancer screening: today, 1st Diet: half and half; fast food lunch Exercise: yes, 3x a week, cardio and 2x resistance training Skin cancer: no worrisome moles  Past Medical History:  Diagnosis Date  . Allergy   . History of sinusitis    Past Surgical History:  Procedure Laterality Date  . BUNIONECTOMY Left    about 73yr ago   Family History  Problem Relation Age of Onset  . Cancer Father     colon  . Kidney Stones Father    Social History  Substance Use Topics  . Smoking status: Never Smoker  . Smokeless tobacco: Never Used  . Alcohol use No   Review of Systems  Musculoskeletal: Positive for arthralgias (left knee; has been running).    Objective:   Vitals:   01/20/16 0829  BP: 116/72  Pulse: 93  Resp: 14  Temp: 98.1 F (36.7 C)  TempSrc: Oral  SpO2: 98%  Weight: 151 lb (68.5 kg)   Body mass index is 26.33 kg/m. Wt Readings from Last 3 Encounters:  01/20/16  151 lb (68.5 kg)  12/24/14 145 lb 6.4 oz (66 kg)  12/02/14 148 lb (67.1 kg)   Physical Exam  Constitutional: She appears well-developed and well-nourished.  HENT:  Head: Normocephalic and atraumatic.  Eyes: Conjunctivae and EOM are normal. Right eye exhibits no hordeolum. Left eye exhibits no hordeolum. No scleral icterus.  Neck: Carotid bruit is not present. No thyromegaly present.  Cardiovascular: Normal rate, regular rhythm, S1 normal, S2 normal and normal heart sounds.   No extrasystoles are present.  Pulmonary/Chest: Effort normal and breath sounds normal. No respiratory distress. Right breast exhibits no inverted nipple, no mass, no nipple discharge, no skin change and no tenderness. Left breast exhibits no inverted nipple, no mass, no nipple discharge, no skin change and no tenderness. Breasts are symmetrical.  Abdominal: Soft. Normal appearance and bowel sounds are normal. She exhibits no distension, no abdominal bruit, no pulsatile midline mass and no mass. There is no hepatosplenomegaly. There is no tenderness. No hernia.  Genitourinary: Uterus normal. Pelvic exam was performed with patient prone. There is no rash or lesion on the right labia. There is no rash or lesion on the left labia. Cervix exhibits no motion tenderness. Right adnexum displays no mass, no tenderness and no fullness. Left adnexum displays no mass, no tenderness and no fullness. No erythema, tenderness or bleeding in the vagina. Vaginal discharge  found.  Genitourinary Comments: Hymen intact; unable to pass brush into vaginal introitus; thin prep collected from introitus  Musculoskeletal: Normal range of motion. She exhibits no edema.       Left knee: She exhibits normal range of motion, no swelling, no effusion, no ecchymosis, no deformity and normal alignment. No tenderness found.  Lymphadenopathy:       Head (right side): No submandibular adenopathy present.       Head (left side): No submandibular adenopathy  present.    She has no cervical adenopathy.    She has no axillary adenopathy.  Neurological: She is alert. She displays no tremor. No cranial nerve deficit. She exhibits normal muscle tone. Gait normal.  Skin: Skin is warm and dry. No bruising and no ecchymosis noted. No cyanosis. No pallor.  Psychiatric: Her speech is normal and behavior is normal. Thought content normal. Her mood appears not anxious. She does not exhibit a depressed mood.   Assessment/Plan:   Problem List Items Addressed This Visit      Other   Vaginal discharge    Check for BV      Relevant Orders   Wet prep, genital   Family hx of colon cancer    Father had colon cancer in his early 69s; refer to GI      Relevant Orders   Ambulatory referral to Gastroenterology   Encounter for screening for HIV    Check today      Relevant Orders   HIV antibody   Cervical cancer screening   Relevant Orders   Pap IG, CT/NG w/ reflex HPV when ASC-U   Annual physical exam    USPSTF grade A and B recommendations reviewed with patient; age-appropriate recommendations, preventive care, screening tests, etc discussed and encouraged; healthy living encouraged; see AVS for patient education given to patient      Relevant Orders   CBC with Differential/Platelet   Lipid panel   TSH   COMPLETE METABOLIC PANEL WITH GFR    Other Visit Diagnoses   None.     No orders of the defined types were placed in this encounter.  Orders Placed This Encounter  Procedures  . Wet prep, genital  . CBC with Differential/Platelet  . Lipid panel  . TSH  . COMPLETE METABOLIC PANEL WITH GFR  . HIV antibody  . Ambulatory referral to Gastroenterology    Referral Priority:   Routine    Referral Type:   Consultation    Referral Reason:   Specialty Services Required    Number of Visits Requested:   1   Follow up plan: Return in about 1 year (around 01/19/2017) for complete physical.  An After Visit Summary was printed and given to the  patient.

## 2016-01-20 NOTE — Patient Instructions (Addendum)
We'll get labs today  Health Maintenance, Female Adopting a healthy lifestyle and getting preventive care can go a long way to promote health and wellness. Talk with your health care provider about what schedule of regular examinations is right for you. This is a good chance for you to check in with your provider about disease prevention and staying healthy. In between checkups, there are plenty of things you can do on your own. Experts have done a lot of research about which lifestyle changes and preventive measures are most likely to keep you healthy. Ask your health care provider for more information. WEIGHT AND DIET  Eat a healthy diet  Be sure to include plenty of vegetables, fruits, low-fat dairy products, and lean protein.  Do not eat a lot of foods high in solid fats, added sugars, or salt.  Get regular exercise. This is one of the most important things you can do for your health.  Most adults should exercise for at least 150 minutes each week. The exercise should increase your heart rate and make you sweat (moderate-intensity exercise).  Most adults should also do strengthening exercises at least twice a week. This is in addition to the moderate-intensity exercise.  Maintain a healthy weight  Body mass index (BMI) is a measurement that can be used to identify possible weight problems. It estimates body fat based on height and weight. Your health care provider can help determine your BMI and help you achieve or maintain a healthy weight.  For females 75 years of age and older:   A BMI below 18.5 is considered underweight.  A BMI of 18.5 to 24.9 is normal.  A BMI of 25 to 29.9 is considered overweight.  A BMI of 30 and above is considered obese.  Watch levels of cholesterol and blood lipids  You should start having your blood tested for lipids and cholesterol at 22 years of age, then have this test every 5 years.  You may need to have your cholesterol levels checked more  often if:  Your lipid or cholesterol levels are high.  You are older than 22 years of age.  You are at high risk for heart disease.  CANCER SCREENING   Lung Cancer  Lung cancer screening is recommended for adults 57-66 years old who are at high risk for lung cancer because of a history of smoking.  A yearly low-dose CT scan of the lungs is recommended for people who:  Currently smoke.  Have quit within the past 15 years.  Have at least a 30-pack-year history of smoking. A pack year is smoking an average of one pack of cigarettes a day for 1 year.  Yearly screening should continue until it has been 15 years since you quit.  Yearly screening should stop if you develop a health problem that would prevent you from having lung cancer treatment.  Breast Cancer  Practice breast self-awareness. This means understanding how your breasts normally appear and feel.  It also means doing regular breast self-exams. Let your health care provider know about any changes, no matter how small.  If you are in your 20s or 30s, you should have a clinical breast exam (CBE) by a health care provider every 1-3 years as part of a regular health exam.  If you are 69 or older, have a CBE every year. Also consider having a breast X-ray (mammogram) every year.  If you have a family history of breast cancer, talk to your health care provider about  genetic screening.  If you are at high risk for breast cancer, talk to your health care provider about having an MRI and a mammogram every year.  Breast cancer gene (BRCA) assessment is recommended for women who have family members with BRCA-related cancers. BRCA-related cancers include:  Breast.  Ovarian.  Tubal.  Peritoneal cancers.  Results of the assessment will determine the need for genetic counseling and BRCA1 and BRCA2 testing. Cervical Cancer Your health care provider may recommend that you be screened regularly for cancer of the pelvic organs  (ovaries, uterus, and vagina). This screening involves a pelvic examination, including checking for microscopic changes to the surface of your cervix (Pap test). You may be encouraged to have this screening done every 3 years, beginning at age 21.  For women ages 30-65, health care providers may recommend pelvic exams and Pap testing every 3 years, or they may recommend the Pap and pelvic exam, combined with testing for human papilloma virus (HPV), every 5 years. Some types of HPV increase your risk of cervical cancer. Testing for HPV may also be done on women of any age with unclear Pap test results.  Other health care providers may not recommend any screening for nonpregnant women who are considered low risk for pelvic cancer and who do not have symptoms. Ask your health care provider if a screening pelvic exam is right for you.  If you have had past treatment for cervical cancer or a condition that could lead to cancer, you need Pap tests and screening for cancer for at least 20 years after your treatment. If Pap tests have been discontinued, your risk factors (such as having a new sexual partner) need to be reassessed to determine if screening should resume. Some women have medical problems that increase the chance of getting cervical cancer. In these cases, your health care provider may recommend more frequent screening and Pap tests. Colorectal Cancer  This type of cancer can be detected and often prevented.  Routine colorectal cancer screening usually begins at 22 years of age and continues through 22 years of age.  Your health care provider may recommend screening at an earlier age if you have risk factors for colon cancer.  Your health care provider may also recommend using home test kits to check for hidden blood in the stool.  A small camera at the end of a tube can be used to examine your colon directly (sigmoidoscopy or colonoscopy). This is done to check for the earliest forms of  colorectal cancer.  Routine screening usually begins at age 50.  Direct examination of the colon should be repeated every 5-10 years through 22 years of age. However, you may need to be screened more often if early forms of precancerous polyps or small growths are found. Skin Cancer  Check your skin from head to toe regularly.  Tell your health care provider about any new moles or changes in moles, especially if there is a change in a mole's shape or color.  Also tell your health care provider if you have a mole that is larger than the size of a pencil eraser.  Always use sunscreen. Apply sunscreen liberally and repeatedly throughout the day.  Protect yourself by wearing long sleeves, pants, a wide-brimmed hat, and sunglasses whenever you are outside. HEART DISEASE, DIABETES, AND HIGH BLOOD PRESSURE   High blood pressure causes heart disease and increases the risk of stroke. High blood pressure is more likely to develop in:  People who have blood   pressure in the high end of the normal range (130-139/85-89 mm Hg).  People who are overweight or obese.  People who are African American.  If you are 18-39 years of age, have your blood pressure checked every 3-5 years. If you are 40 years of age or older, have your blood pressure checked every year. You should have your blood pressure measured twice--once when you are at a hospital or clinic, and once when you are not at a hospital or clinic. Record the average of the two measurements. To check your blood pressure when you are not at a hospital or clinic, you can use:  An automated blood pressure machine at a pharmacy.  A home blood pressure monitor.  If you are between 55 years and 79 years old, ask your health care provider if you should take aspirin to prevent strokes.  Have regular diabetes screenings. This involves taking a blood sample to check your fasting blood sugar level.  If you are at a normal weight and have a low risk for  diabetes, have this test once every three years after 22 years of age.  If you are overweight and have a high risk for diabetes, consider being tested at a younger age or more often. PREVENTING INFECTION  Hepatitis B  If you have a higher risk for hepatitis B, you should be screened for this virus. You are considered at high risk for hepatitis B if:  You were born in a country where hepatitis B is common. Ask your health care provider which countries are considered high risk.  Your parents were born in a high-risk country, and you have not been immunized against hepatitis B (hepatitis B vaccine).  You have HIV or AIDS.  You use needles to inject street drugs.  You live with someone who has hepatitis B.  You have had sex with someone who has hepatitis B.  You get hemodialysis treatment.  You take certain medicines for conditions, including cancer, organ transplantation, and autoimmune conditions. Hepatitis C  Blood testing is recommended for:  Everyone born from 1945 through 1965.  Anyone with known risk factors for hepatitis C. Sexually transmitted infections (STIs)  You should be screened for sexually transmitted infections (STIs) including gonorrhea and chlamydia if:  You are sexually active and are younger than 22 years of age.  You are older than 22 years of age and your health care provider tells you that you are at risk for this type of infection.  Your sexual activity has changed since you were last screened and you are at an increased risk for chlamydia or gonorrhea. Ask your health care provider if you are at risk.  If you do not have HIV, but are at risk, it may be recommended that you take a prescription medicine daily to prevent HIV infection. This is called pre-exposure prophylaxis (PrEP). You are considered at risk if:  You are sexually active and do not regularly use condoms or know the HIV status of your partner(s).  You take drugs by injection.  You are  sexually active with a partner who has HIV. Talk with your health care provider about whether you are at high risk of being infected with HIV. If you choose to begin PrEP, you should first be tested for HIV. You should then be tested every 3 months for as long as you are taking PrEP.  PREGNANCY   If you are premenopausal and you may become pregnant, ask your health care provider about preconception   counseling.  If you may become pregnant, take 400 to 800 micrograms (mcg) of folic acid every day.  If you want to prevent pregnancy, talk to your health care provider about birth control (contraception). OSTEOPOROSIS AND MENOPAUSE   Osteoporosis is a disease in which the bones lose minerals and strength with aging. This can result in serious bone fractures. Your risk for osteoporosis can be identified using a bone density scan.  If you are 54 years of age or older, or if you are at risk for osteoporosis and fractures, ask your health care provider if you should be screened.  Ask your health care provider whether you should take a calcium or vitamin D supplement to lower your risk for osteoporosis.  Menopause may have certain physical symptoms and risks.  Hormone replacement therapy may reduce some of these symptoms and risks. Talk to your health care provider about whether hormone replacement therapy is right for you.  HOME CARE INSTRUCTIONS   Schedule regular health, dental, and eye exams.  Stay current with your immunizations.   Do not use any tobacco products including cigarettes, chewing tobacco, or electronic cigarettes.  If you are pregnant, do not drink alcohol.  If you are breastfeeding, limit how much and how often you drink alcohol.  Limit alcohol intake to no more than 1 drink per day for nonpregnant women. One drink equals 12 ounces of beer, 5 ounces of wine, or 1 ounces of hard liquor.  Do not use street drugs.  Do not share needles.  Ask your health care provider for  help if you need support or information about quitting drugs.  Tell your health care provider if you often feel depressed.  Tell your health care provider if you have ever been abused or do not feel safe at home.   This information is not intended to replace advice given to you by your health care provider. Make sure you discuss any questions you have with your health care provider.   Document Released: 10/26/2010 Document Revised: 05/03/2014 Document Reviewed: 03/14/2013 Elsevier Interactive Patient Education [employer].

## 2016-01-20 NOTE — Assessment & Plan Note (Signed)
Father had colon cancer in his early 78s; refer to GI

## 2016-01-20 NOTE — Assessment & Plan Note (Signed)
Check today 

## 2016-01-20 NOTE — Assessment & Plan Note (Signed)
USPSTF grade A and B recommendations reviewed with patient; age-appropriate recommendations, preventive care, screening tests, etc discussed and encouraged; healthy living encouraged; see AVS for patient education given to patient  

## 2016-01-21 LAB — WET PREP BY MOLECULAR PROBE
CANDIDA SPECIES: NEGATIVE
Gardnerella vaginalis: NEGATIVE
TRICHOMONAS VAG: NEGATIVE

## 2016-01-21 LAB — HIV ANTIBODY (ROUTINE TESTING W REFLEX): HIV 1&2 Ab, 4th Generation: NONREACTIVE

## 2016-02-27 ENCOUNTER — Telehealth: Payer: Self-pay | Admitting: Family Medicine

## 2016-02-27 NOTE — Telephone Encounter (Signed)
She has already been contact about the colonoscopy; they have also referred her for genetic testing; no breast lumps; no other signs/sx of cancer; she says her father was actually 68 when he got colon cancer, not 48

## 2016-02-27 NOTE — Telephone Encounter (Signed)
I called, pt is at work; will call me back

## 2016-03-10 ENCOUNTER — Encounter: Payer: Self-pay | Admitting: Family Medicine

## 2017-11-02 ENCOUNTER — Encounter: Payer: Self-pay | Admitting: Family Medicine

## 2017-11-02 ENCOUNTER — Ambulatory Visit (INDEPENDENT_AMBULATORY_CARE_PROVIDER_SITE_OTHER): Payer: Managed Care, Other (non HMO) | Admitting: Family Medicine

## 2017-11-02 ENCOUNTER — Other Ambulatory Visit (HOSPITAL_COMMUNITY)
Admission: RE | Admit: 2017-11-02 | Discharge: 2017-11-02 | Disposition: A | Discharge status: Home / Self Care | Payer: Managed Care, Other (non HMO) | Source: Ambulatory Visit | Attending: Family Medicine | Admitting: Family Medicine

## 2017-11-02 VITALS — BP 112/70 | HR 73 | Temp 98.3°F | Resp 12 | Ht 64.0 in | Wt 151.6 lb

## 2017-11-02 DIAGNOSIS — Z0000 Encounter for general adult medical examination without abnormal findings: Secondary | ICD-10-CM | POA: Insufficient documentation

## 2017-11-02 DIAGNOSIS — Z3009 Encounter for other general counseling and advice on contraception: Secondary | ICD-10-CM

## 2017-11-02 DIAGNOSIS — Z113 Encounter for screening for infections with a predominantly sexual mode of transmission: Secondary | ICD-10-CM | POA: Diagnosis not present

## 2017-11-02 DIAGNOSIS — R21 Rash and other nonspecific skin eruption: Secondary | ICD-10-CM | POA: Diagnosis not present

## 2017-11-02 DIAGNOSIS — Z124 Encounter for screening for malignant neoplasm of cervix: Secondary | ICD-10-CM | POA: Diagnosis not present

## 2017-11-02 DIAGNOSIS — Z800 Family history of malignant neoplasm of digestive organs: Secondary | ICD-10-CM | POA: Diagnosis not present

## 2017-11-02 DIAGNOSIS — Z0001 Encounter for general adult medical examination with abnormal findings: Secondary | ICD-10-CM

## 2017-11-02 MED ORDER — TRIAMCINOLONE ACETONIDE 0.1 % EX CREA: 1.0000 "application " | TOPICAL_CREAM | Freq: Three times a day (TID) | CUTANEOUS | 0 refills | Status: DC | PRN

## 2017-11-02 NOTE — Assessment & Plan Note (Signed)
Patient is a virgin; will refer to GYN for evaluation, pap smear

## 2017-11-02 NOTE — Progress Notes (Signed)
Patient ID: Caitlin Roberts, female   DOB: October 05, 1995, 22 y.o.   MRN: [id number]   Subjective:   Caitlin Roberts is a 22 y.o. female here for a complete physical exam  Interim issues since last visit: she has developed a rash on the right shoulder, just started Sunday; it was really itchy and then just stopped; she was scratching it really hard; then just stopped; she did not have HC cream; also had a rash on the left hand; was much bigger; it was the color of her skin, but when scratched it would turn red; used HC cream on that  She is getting married in November; virginal; not wearing tampons, only if flow is heavy, but can feel the tampon, maybe not all way in; some discomfort with inserting the tampon, very sensitive  USPSTF grade A and B recommendations Depression:  Depression screen [facility] 2/9 11/02/2017 01/20/2016 12/24/2014  Decreased Interest 0 0 0  Down, Depressed, Hopeless 0 0 0  PHQ - 2 Score 0 0 0   Hypertension: BP Readings from Last 3 Encounters:  11/02/17 112/70  01/20/16 116/72  10/29/15 114/76   Obesity: Wt Readings from Last 3 Encounters:  11/02/17 151 lb 9.6 oz (68.8 kg)  01/20/16 151 lb (68.5 kg)  12/24/14 145 lb 6.4 oz (66 kg)   BMI Readings from Last 3 Encounters:  11/02/17 26.02 kg/m  01/20/16 26.33 kg/m  12/24/14 25.35 kg/m    Skin cancer: nothing like skin cancer Lung cancer:  nonsmoker Breast cancer: no lumps or bumps Colorectal cancer: father had cancer (polyps); cancer diagnosed at 1; patient will start at age 49 Cervical cancer screening: will try today BRCA gene screening: family hx of breast and/or ovarian cancer and/or metastatic prostate cancer?  HIV, hep B, hep C: interested STD testing and prevention (chl/gon/syphilis): interested Intimate partner violence: no abuse Contraception: getting married in November; she is thinking about things, does not want anything in the vagina; she is considering the implantable versus the pills; thinks she  could remember; hx of migraines with aura (little black lights); schedule for work varies Osteoporosis: n/a Fall prevention/vitamin D: discussed Immunizations: cannot remember if tetanus is due; she will check on that; had HPV she says, two shots already and not the third; she was preteen when started, age 6 and the 2nd a few months later Diet: lately, not a good eater, skipping meal because of work; does not eat pork Exercise: does a lot of physical activity daily, not really exercising Alcohol: does, very occasional, 2-4 x a month, 3-4 drinks Tobacco use: no Glucose: check today Glucose, Bld  Date Value Ref Range Status  01/20/2016 85 65 - 99 mg/dL Final   Lipids:  Total 166, HDL 53.9, LDL 94, TG 86  Creatinine was 0.68, SGOT and SGPT normal, GGT normal, alk phos normal, urine normal  Lab Results  Component Value Date   CHOL 162 01/20/2016   Lab Results  Component Value Date   HDL 49 01/20/2016   Lab Results  Component Value Date   LDLCALC 98 01/20/2016   Lab Results  Component Value Date   TRIG 73 01/20/2016   Lab Results  Component Value Date   CHOLHDL 3.3 01/20/2016   No results found for: LDLDIRECT   Past Medical History:  Diagnosis Date  . Allergy   . History of sinusitis    Past Surgical History:  Procedure Laterality Date  . BUNIONECTOMY Left    about 48yr ago   Family  History  Problem Relation Age of Onset  . Cancer Father        colon  . Kidney Stones Father    Social History   Tobacco Use  . Smoking status: Never Smoker  . Smokeless tobacco: Never Used  Substance Use Topics  . Alcohol use: Yes    Alcohol/week: 0.0 oz    Comment: occasionally  . Drug use: No   Review of Systems  Objective:   Vitals:   11/02/17 1012  BP: 112/70  Pulse: 73  Resp: 12  Temp: 98.3 F (36.8 C)  TempSrc: Oral  SpO2: 98%  Weight: 151 lb 9.6 oz (68.8 kg)  Height: '5\' 4"'  (1.626 m)   Body mass index is 26.02 kg/m. Wt Readings from Last 3  Encounters:  11/02/17 151 lb 9.6 oz (68.8 kg)  01/20/16 151 lb (68.5 kg)  12/24/14 145 lb 6.4 oz (66 kg)   Physical Exam  Constitutional: She appears well-developed and well-nourished.  HENT:  Head: Normocephalic and atraumatic.  Eyes: Conjunctivae and EOM are normal. Right eye exhibits no hordeolum. Left eye exhibits no hordeolum. No scleral icterus.  Neck: Carotid bruit is not present. No thyromegaly present.  Cardiovascular: Normal rate, regular rhythm, S1 normal, S2 normal and normal heart sounds.  No extrasystoles are present.  Pulmonary/Chest: Effort normal and breath sounds normal. No respiratory distress. Right breast exhibits no inverted nipple, no mass, no nipple discharge, no skin change and no tenderness. Left breast exhibits no inverted nipple, no mass, no nipple discharge, no skin change and no tenderness. Breasts are symmetrical.  Abdominal: Soft. Normal appearance and bowel sounds are normal. She exhibits no distension, no abdominal bruit, no pulsatile midline mass and no mass. There is no hepatosplenomegaly. There is no tenderness. No hernia.  Genitourinary: Uterus normal. Pelvic exam was performed with patient prone. There is no rash or lesion on the right labia. There is no rash or lesion on the left labia. Cervix exhibits no motion tenderness. Right adnexum displays no mass, no tenderness and no fullness. Left adnexum displays no mass, no tenderness and no fullness.  Musculoskeletal: Normal range of motion. She exhibits no edema.  Lymphadenopathy:       Head (right side): No submandibular adenopathy present.       Head (left side): No submandibular adenopathy present.    She has no cervical adenopathy.    She has no axillary adenopathy.  Neurological: She is alert. She displays no tremor. No cranial nerve deficit. She exhibits normal muscle tone. Gait normal.  Skin: Skin is warm and dry. Rash (3 discrete patches on the right shoulder; on e lightly erythematous patch on left  wrist) noted. No bruising and no ecchymosis noted. No cyanosis. No pallor.  Psychiatric: Her speech is normal and behavior is normal. Thought content normal. Her mood appears not anxious. She does not exhibit a depressed mood.    Assessment/Plan:   Problem List Items Addressed This Visit      Other   Preventative health care - Primary    USPSTF grade A and B recommendations reviewed with patient; age-appropriate recommendations, preventive care, screening tests, etc discussed and encouraged; healthy living encouraged; see AVS for patient education given to patient      Relevant Orders   CBC with Differential/Platelet   TSH   Family hx of colon cancer    She will start screening at age 39; reminder sent for future reference to enter referral in 2020 just before patient turns 72  Cervical cancer screening    Patient is a virgin; will refer to GYN for evaluation, pap smear      Relevant Orders   Ambulatory referral to Obstetrics / Gynecology   Urine cytology ancillary only    Other Visit Diagnoses    Encounter for other general counseling or advice on contraception       pt has hx of migraine with aura; interested in implantable contraceptive; refer to GYN   Relevant Orders   Ambulatory referral to Obstetrics / Gynecology   Screen for STD (sexually transmitted disease)       Relevant Orders   Hepatitis panel, acute   HIV antibody   RPR   Trichomonas vaginalis, RNA   GC/Chlamydia probe amp ([facility])not at [facility]   Rash       lesions on shoulder appear to be insect bites; wrist appears more contact or eczema; will use same triamcinolone for both; too strong for face       Meds ordered this encounter  Medications  . triamcinolone cream (KENALOG) 0.1 %    Sig: Apply 1 application topically 3 (three) times daily as needed. Too strong for face    Dispense:  15 g    Refill:  0   Orders Placed This Encounter  Procedures  . Trichomonas vaginalis, RNA  . Hepatitis  panel, acute  . HIV antibody  . RPR  . CBC with Differential/Platelet  . TSH  . Ambulatory referral to Obstetrics / Gynecology    Referral Priority:   Routine    Referral Type:   Consultation    Referral Reason:   Specialty Services Required    Requested Specialty:   Obstetrics and Gynecology    Number of Visits Requested:   1    Follow up plan: Return in about 1 year (around 11/03/2018) for complete physical.  An After Visit Summary was printed and given to the patient.

## 2017-11-02 NOTE — Patient Instructions (Addendum)
We'll have you see the gynecologist for your pap smear and birth control discussion If you have not heard anything from my staff in a week about any orders/referrals/studies from today, please contact us here to follow-up [phone]  Health Maintenance, Female Adopting a healthy lifestyle and getting preventive care can go a long way to promote health and wellness. Talk with your health care provider about what schedule of regular examinations is right for you. This is a good chance for you to check in with your provider about disease prevention and staying healthy. In between checkups, there are plenty of things you can do on your own. Experts have done a lot of research about which lifestyle changes and preventive measures are most likely to keep you healthy. Ask your health care provider for more information. Weight and diet Eat a healthy diet  Be sure to include plenty of vegetables, fruits, low-fat dairy products, and lean protein.  Do not eat a lot of foods high in solid fats, added sugars, or salt.  Get regular exercise. This is one of the most important things you can do for your health. ? Most adults should exercise for at least 150 minutes each week. The exercise should increase your heart rate and make you sweat (moderate-intensity exercise). ? Most adults should also do strengthening exercises at least twice a week. This is in addition to the moderate-intensity exercise.  Maintain a healthy weight  Body mass index (BMI) is a measurement that can be used to identify possible weight problems. It estimates body fat based on height and weight. Your health care provider can help determine your BMI and help you achieve or maintain a healthy weight.  For females 75 years of age and older: ? A BMI below 18.5 is considered underweight. ? A BMI of 18.5 to 24.9 is normal. ? A BMI of 25 to 29.9 is considered overweight. ? A BMI of 30 and above is considered obese.  Watch levels of  cholesterol and blood lipids  You should start having your blood tested for lipids and cholesterol at 24 years of age, then have this test every 5 years.  You may need to have your cholesterol levels checked more often if: ? Your lipid or cholesterol levels are high. ? You are older than 24 years of age. ? You are at high risk for heart disease.  Cancer screening Lung Cancer  Lung cancer screening is recommended for adults 26-15 years old who are at high risk for lung cancer because of a history of smoking.  A yearly low-dose CT scan of the lungs is recommended for people who: ? Currently smoke. ? Have quit within the past 15 years. ? Have at least a 30-pack-year history of smoking. A pack year is smoking an average of one pack of cigarettes a day for 1 year.  Yearly screening should continue until it has been 15 years since you quit.  Yearly screening should stop if you develop a health problem that would prevent you from having lung cancer treatment.  Breast Cancer  Practice breast self-awareness. This means understanding how your breasts normally appear and feel.  It also means doing regular breast self-exams. Let your health care provider know about any changes, no matter how small.  If you are in your 20s or 30s, you should have a clinical breast exam (CBE) by a health care provider every 1-3 years as part of a regular health exam.  If you are 40 or older,  have a CBE every year. Also consider having a breast X-ray (mammogram) every year.  If you have a family history of breast cancer, talk to your health care provider about genetic screening.  If you are at high risk for breast cancer, talk to your health care provider about having an MRI and a mammogram every year.  Breast cancer gene (BRCA) assessment is recommended for women who have family members with BRCA-related cancers. BRCA-related cancers include: ? Breast. ? Ovarian. ? Tubal. ? Peritoneal cancers.  Results  of the assessment will determine the need for genetic counseling and BRCA1 and BRCA2 testing.  Cervical Cancer Your health care provider may recommend that you be screened regularly for cancer of the pelvic organs (ovaries, uterus, and vagina). This screening involves a pelvic examination, including checking for microscopic changes to the surface of your cervix (Pap test). You may be encouraged to have this screening done every 3 years, beginning at age 72.  For women ages 59-65, health care providers may recommend pelvic exams and Pap testing every 3 years, or they may recommend the Pap and pelvic exam, combined with testing for human papilloma virus (HPV), every 5 years. Some types of HPV increase your risk of cervical cancer. Testing for HPV may also be done on women of any age with unclear Pap test results.  Other health care providers may not recommend any screening for nonpregnant women who are considered low risk for pelvic cancer and who do not have symptoms. Ask your health care provider if a screening pelvic exam is right for you.  If you have had past treatment for cervical cancer or a condition that could lead to cancer, you need Pap tests and screening for cancer for at least 20 years after your treatment. If Pap tests have been discontinued, your risk factors (such as having a new sexual partner) need to be reassessed to determine if screening should resume. Some women have medical problems that increase the chance of getting cervical cancer. In these cases, your health care provider may recommend more frequent screening and Pap tests.  Colorectal Cancer  This type of cancer can be detected and often prevented.  Routine colorectal cancer screening usually begins at 24 years of age and continues through 24 years of age.  Your health care provider may recommend screening at an earlier age if you have risk factors for colon cancer.  Your health care provider may also recommend using  home test kits to check for hidden blood in the stool.  A small camera at the end of a tube can be used to examine your colon directly (sigmoidoscopy or colonoscopy). This is done to check for the earliest forms of colorectal cancer.  Routine screening usually begins at age 71.  Direct examination of the colon should be repeated every 5-10 years through 24 years of age. However, you may need to be screened more often if early forms of precancerous polyps or small growths are found.  Skin Cancer  Check your skin from head to toe regularly.  Tell your health care provider about any new moles or changes in moles, especially if there is a change in a mole's shape or color.  Also tell your health care provider if you have a mole that is larger than the size of a pencil eraser.  Always use sunscreen. Apply sunscreen liberally and repeatedly throughout the day.  Protect yourself by wearing long sleeves, pants, a wide-brimmed hat, and sunglasses whenever you are outside.  Heart disease, diabetes, and high blood pressure  High blood pressure causes heart disease and increases the risk of stroke. High blood pressure is more likely to develop in: ? People who have blood pressure in the high end of the normal range (130-139/85-89 mm Hg). ? People who are overweight or obese. ? People who are African American.  If you are 13-66 years of age, have your blood pressure checked every 3-5 years. If you are 65 years of age or older, have your blood pressure checked every year. You should have your blood pressure measured twice-once when you are at a hospital or clinic, and once when you are not at a hospital or clinic. Record the average of the two measurements. To check your blood pressure when you are not at a hospital or clinic, you can use: ? An automated blood pressure machine at a pharmacy. ? A home blood pressure monitor.  If you are between 3 years and 60 years old, ask your health care provider  if you should take aspirin to prevent strokes.  Have regular diabetes screenings. This involves taking a blood sample to check your fasting blood sugar level. ? If you are at a normal weight and have a low risk for diabetes, have this test once every three years after 24 years of age. ? If you are overweight and have a high risk for diabetes, consider being tested at a younger age or more often. Preventing infection Hepatitis B  If you have a higher risk for hepatitis B, you should be screened for this virus. You are considered at high risk for hepatitis B if: ? You were born in a country where hepatitis B is common. Ask your health care provider which countries are considered high risk. ? Your parents were born in a high-risk country, and you have not been immunized against hepatitis B (hepatitis B vaccine). ? You have HIV or AIDS. ? You use needles to inject street drugs. ? You live with someone who has hepatitis B. ? You have had sex with someone who has hepatitis B. ? You get hemodialysis treatment. ? You take certain medicines for conditions, including cancer, organ transplantation, and autoimmune conditions.  Hepatitis C  Blood testing is recommended for: ? Everyone born from [birth year] through 1967. ? Anyone with known risk factors for hepatitis C.  Sexually transmitted infections (STIs)  You should be screened for sexually transmitted infections (STIs) including gonorrhea and chlamydia if: ? You are sexually active and are younger than 24 years of age. ? You are older than 24 years of age and your health care provider tells you that you are at risk for this type of infection. ? Your sexual activity has changed since you were last screened and you are at an increased risk for chlamydia or gonorrhea. Ask your health care provider if you are at risk.  If you do not have HIV, but are at risk, it may be recommended that you take a prescription medicine daily to prevent HIV infection. This  is called pre-exposure prophylaxis (PrEP). You are considered at risk if: ? You are sexually active and do not regularly use condoms or know the HIV status of your partner(s). ? You take drugs by injection. ? You are sexually active with a partner who has HIV.  Talk with your health care provider about whether you are at high risk of being infected with HIV. If you choose to begin PrEP, you should first be tested for HIV.  You should then be tested every 3 months for as long as you are taking PrEP. Pregnancy  If you are premenopausal and you may become pregnant, ask your health care provider about preconception counseling.  If you may become pregnant, take 400 to 800 micrograms (mcg) of folic acid every day.  If you want to prevent pregnancy, talk to your health care provider about birth control (contraception). Osteoporosis and menopause  Osteoporosis is a disease in which the bones lose minerals and strength with aging. This can result in serious bone fractures. Your risk for osteoporosis can be identified using a bone density scan.  If you are 71 years of age or older, or if you are at risk for osteoporosis and fractures, ask your health care provider if you should be screened.  Ask your health care provider whether you should take a calcium or vitamin D supplement to lower your risk for osteoporosis.  Menopause may have certain physical symptoms and risks.  Hormone replacement therapy may reduce some of these symptoms and risks. Talk to your health care provider about whether hormone replacement therapy is right for you. Follow these instructions at home:  Schedule regular health, dental, and eye exams.  Stay current with your immunizations.  Do not use any tobacco products including cigarettes, chewing tobacco, or electronic cigarettes.  If you are pregnant, do not drink alcohol.  If you are breastfeeding, limit how much and how often you drink alcohol.  Limit alcohol intake  to no more than 1 drink per day for nonpregnant women. One drink equals 12 ounces of beer, 5 ounces of wine, or 1 ounces of hard liquor.  Do not use street drugs.  Do not share needles.  Ask your health care provider for help if you need support or information about quitting drugs.  Tell your health care provider if you often feel depressed.  Tell your health care provider if you have ever been abused or do not feel safe at home. This information is not intended to replace advice given to you by your health care provider. Make sure you discuss any questions you have with your health care provider. Document Released: 10/26/2010 Document Revised: 09/18/2015 Document Reviewed: 01/14/2015 Elsevier Interactive Patient Education  2018 [place] implant What is this medicine? ETONOGESTREL (et oh noe JES trel) is a contraceptive (birth control) device. It is used to prevent pregnancy. It can be used for up to 3 years. This medicine may be used for other purposes; ask your health care provider or pharmacist if you have questions. COMMON BRAND NAME(S): Implanon, Nexplanon What should I tell my health care provider before I take this medicine? They need to know if you have any of these conditions: -abnormal vaginal bleeding -blood vessel disease or blood clots -cancer of the breast, cervix, or liver -depression -diabetes -gallbladder disease -headaches -heart disease or recent heart attack -high blood pressure -high cholesterol -kidney disease -liver disease -renal disease -seizures -tobacco smoker -an unusual or allergic reaction to etonogestrel, other hormones, anesthetics or antiseptics, medicines, foods, dyes, or preservatives -pregnant or trying to get pregnant -breast-feeding How should I use this medicine? This device is inserted just under the skin on the inner side of your upper arm by a health care professional. Talk to your pediatrician regarding the use of  this medicine in children. Special care may be needed. Overdosage: If you think you have taken too much of this medicine contact a poison control center or emergency room at once.  NOTE: This medicine is only for you. Do not share this medicine with others. What if I miss a dose? This does not apply. What may interact with this medicine? Do not take this medicine with any of the following medications: -amprenavir -bosentan -fosamprenavir This medicine may also interact with the following medications: -barbiturate medicines for inducing sleep or treating seizures -certain medicines for fungal infections like ketoconazole and itraconazole -grapefruit juice -griseofulvin -medicines to treat seizures like carbamazepine, felbamate, oxcarbazepine, phenytoin, topiramate -modafinil -phenylbutazone -rifampin -rufinamide -some medicines to treat HIV infection like atazanavir, indinavir, lopinavir, nelfinavir, tipranavir, ritonavir -St. John's wort This list may not describe all possible interactions. Give your health care provider a list of all the medicines, herbs, non-prescription drugs, or dietary supplements you use. Also tell them if you smoke, drink alcohol, or use illegal drugs. Some items may interact with your medicine. What should I watch for while using this medicine? This product does not protect you against HIV infection (AIDS) or other sexually transmitted diseases. You should be able to feel the implant by pressing your fingertips over the skin where it was inserted. Contact your doctor if you cannot feel the implant, and use a non-hormonal birth control method (such as condoms) until your doctor confirms that the implant is in place. If you feel that the implant may have broken or become bent while in your arm, contact your healthcare provider. What side effects may I notice from receiving this medicine? Side effects that you should report to your doctor or health care professional as  soon as possible: -allergic reactions like skin rash, itching or hives, swelling of the face, lips, or tongue -breast lumps -changes in emotions or moods -depressed mood -heavy or prolonged menstrual bleeding -pain, irritation, swelling, or bruising at the insertion site -scar at site of insertion -signs of infection at the insertion site such as fever, and skin redness, pain or discharge -signs of pregnancy -signs and symptoms of a blood clot such as breathing problems; changes in vision; chest pain; severe, sudden headache; pain, swelling, warmth in the leg; trouble speaking; sudden numbness or weakness of the face, arm or leg -signs and symptoms of liver injury like dark yellow or brown urine; general ill feeling or flu-like symptoms; light-colored stools; loss of appetite; nausea; right upper belly pain; unusually weak or tired; yellowing of the eyes or skin -unusual vaginal bleeding, discharge -signs and symptoms of a stroke like changes in vision; confusion; trouble speaking or understanding; severe headaches; sudden numbness or weakness of the face, arm or leg; trouble walking; dizziness; loss of balance or coordination Side effects that usually do not require medical attention (report to your doctor or health care professional if they continue or are bothersome): -acne -back pain -breast pain -changes in weight -dizziness -general ill feeling or flu-like symptoms -headache -irregular menstrual bleeding -nausea -sore throat -vaginal irritation or inflammation This list may not describe all possible side effects. Call your doctor for medical advice about side effects. You may report side effects to FDA at 1-800-FDA-1088. Where should I keep my medicine? This drug is given in a hospital or clinic and will not be stored at home. NOTE: This sheet is a summary. It may not cover all possible information. If you have questions about this medicine, talk to your doctor, pharmacist, or  health care provider.  2018 Elsevier/Gold Standard (2015-10-30 11:19:22)

## 2017-11-02 NOTE — Assessment & Plan Note (Signed)
USPSTF grade A and B recommendations reviewed with patient; age-appropriate recommendations, preventive care, screening tests, etc discussed and encouraged; healthy living encouraged; see AVS for patient education given to patient  

## 2017-11-02 NOTE — Assessment & Plan Note (Signed)
She will start screening at age 22; reminder sent for future reference to enter referral in 2020 just before patient turns 23

## 2017-11-03 LAB — CBC WITH DIFFERENTIAL/PLATELET
BASOS ABS: 51 {cells}/uL (ref 0–200)
Basophils Relative: 0.8 %
EOS PCT: 1.4 %
Eosinophils Absolute: 90 cells/uL (ref 15–500)
HEMATOCRIT: 40.6 % (ref 35.0–45.0)
Hemoglobin: 13.8 g/dL (ref 11.7–15.5)
LYMPHS ABS: 2579 {cells}/uL (ref 850–3900)
MCH: 29.6 pg (ref 27.0–33.0)
MCHC: 34 g/dL (ref 32.0–36.0)
MCV: 86.9 fL (ref 80.0–100.0)
MPV: 10.1 fL (ref 7.5–12.5)
Monocytes Relative: 8.3 %
Neutro Abs: 3149 cells/uL (ref 1500–7800)
Neutrophils Relative %: 49.2 %
Platelets: 303 10*3/uL (ref 140–400)
RBC: 4.67 10*6/uL (ref 3.80–5.10)
RDW: 12.8 % (ref 11.0–15.0)
Total Lymphocyte: 40.3 %
WBC mixed population: 531 cells/uL (ref 200–950)
WBC: 6.4 10*3/uL (ref 3.8–10.8)

## 2017-11-03 LAB — HEPATITIS PANEL, ACUTE
HEP B C IGM: NONREACTIVE
HEP B S AG: NONREACTIVE
Hep A IgM: NONREACTIVE
Hepatitis C Ab: NONREACTIVE
SIGNAL TO CUT-OFF: 0.01 (ref <1.00)

## 2017-11-03 LAB — TSH: TSH: 1.29 mIU/L

## 2017-11-03 LAB — URINE CYTOLOGY ANCILLARY ONLY
Chlamydia: NEGATIVE
NEISSERIA GONORRHEA: NEGATIVE
TRICH (WINDOWPATH): NEGATIVE

## 2017-11-03 LAB — RPR: RPR Ser Ql: NONREACTIVE

## 2017-11-03 LAB — HIV ANTIBODY (ROUTINE TESTING W REFLEX): HIV 1&2 Ab, 4th Generation: NONREACTIVE

## 2017-11-04 ENCOUNTER — Telehealth: Payer: Self-pay | Admitting: Obstetrics & Gynecology

## 2017-11-04 NOTE — Telephone Encounter (Signed)
[facility] referring for Encounter for other general counseling or advice on contraception, Cervical cancer screening.Attempt to reach patient voicemail box is not set up unable to leave message to call back to schedule

## 2017-11-09 NOTE — Telephone Encounter (Signed)
Attempt to reach patient voicemail box is not set up unable to leave message to call back to schedule

## 2017-11-16 NOTE — Telephone Encounter (Signed)
Attempt to reach patient voicemail box is not set up unable to leave message to call back to schedule

## 2017-11-17 NOTE — Telephone Encounter (Signed)
Referral notice sent to PCP

## 2017-12-16 ENCOUNTER — Ambulatory Visit (INDEPENDENT_AMBULATORY_CARE_PROVIDER_SITE_OTHER): Payer: BLUE CROSS/BLUE SHIELD | Admitting: Obstetrics and Gynecology

## 2017-12-16 ENCOUNTER — Other Ambulatory Visit (HOSPITAL_COMMUNITY)
Admission: RE | Admit: 2017-12-16 | Discharge: 2017-12-16 | Disposition: A | Discharge status: Home / Self Care | Payer: Managed Care, Other (non HMO) | Source: Ambulatory Visit | Attending: Obstetrics and Gynecology | Admitting: Obstetrics and Gynecology

## 2017-12-16 ENCOUNTER — Encounter: Payer: Self-pay | Admitting: Obstetrics and Gynecology

## 2017-12-16 VITALS — BP 110/66 | HR 79 | Ht 63.5 in | Wt 152.0 lb

## 2017-12-16 DIAGNOSIS — Z3009 Encounter for other general counseling and advice on contraception: Secondary | ICD-10-CM | POA: Diagnosis not present

## 2017-12-16 DIAGNOSIS — Z30017 Encounter for initial prescription of implantable subdermal contraceptive: Secondary | ICD-10-CM

## 2017-12-16 DIAGNOSIS — Z3046 Encounter for surveillance of implantable subdermal contraceptive: Secondary | ICD-10-CM | POA: Diagnosis not present

## 2017-12-16 DIAGNOSIS — Z124 Encounter for screening for malignant neoplasm of cervix: Secondary | ICD-10-CM

## 2017-12-16 DIAGNOSIS — Z8669 Personal history of other diseases of the nervous system and sense organs: Secondary | ICD-10-CM

## 2017-12-16 MED ORDER — ETONOGESTREL 68 MG ~~LOC~~ IMPL: 68.0000 mg | DRUG_IMPLANT | Freq: Once | SUBCUTANEOUS | Status: AC

## 2017-12-16 NOTE — Progress Notes (Signed)
Patient ID: Caitlin Roberts, female   DOB: 01-11-1996, 22 y.o.   MRN: [id number]  Reason for Consult: Abnormal Pap Smear (Referred by [facility]) and Discuss Contraception   Referred by Arnetha Courser, MD  Subjective:     HPI:  Caitlin Roberts is a 22 y.o. female  She presents today for birth control consultation. She would like to have the nexplanon placed. She is virginal. She has tried to have a pap smear performed with her primary care physician but it has not been successful She would like to try to have a pap smear done today. She is getting married in November.   Past Medical History:  Diagnosis Date  . Allergy   . History of sinusitis    Family History  Problem Relation Age of Onset  . Cancer Father        colon  . Kidney Stones Father    Past Surgical History:  Procedure Laterality Date  . BUNIONECTOMY Left    about 40yrs ago    Short Social History:  Social History   Tobacco Use  . Smoking status: Never Smoker  . Smokeless tobacco: Never Used  Substance Use Topics  . Alcohol use: Yes    Alcohol/week: 0.0 standard drinks    Comment: occasionally    Allergies  Allergen Reactions  . Pork-Derived Products     Current Outpatient Medications  Medication Sig Dispense Refill  . fluticasone (FLONASE) 50 MCG/ACT nasal spray Place 2 sprays into both nostrils daily. (Patient not taking: Reported on 11/02/2017) 16 g 1  . triamcinolone cream (KENALOG) 0.1 % Apply 1 application topically 3 (three) times daily as needed. Too strong for face (Patient not taking: Reported on 12/16/2017) 15 g 0   No current facility-administered medications for this visit.     Review of Systems  Constitutional: Negative for chills, fatigue, fever and unexpected weight change.  HENT: Negative for trouble swallowing.  Eyes: Negative for loss of vision.  Respiratory: Negative for cough, shortness of breath and wheezing.  Cardiovascular: Negative for chest pain, leg swelling,  palpitations and syncope.  GI: Negative for abdominal pain, blood in stool, diarrhea, nausea and vomiting.  GU: Negative for difficulty urinating, dysuria, frequency and hematuria.  Musculoskeletal: Negative for back pain, leg pain and joint pain.  Skin: Negative for rash.  Neurological: Negative for dizziness, headaches, light-headedness, numbness and seizures.  Psychiatric: Negative for behavioral problem, confusion, depressed mood and sleep disturbance.        Objective:  Objective   Vitals:   12/16/17 0929  BP: 110/66  Pulse: 79  Weight: 152 lb (68.9 kg)  Height: 5' 3.5" (1.613 m)   Body mass index is 26.5 kg/m.  Physical Exam  Constitutional: She is oriented to person, place, and time. She appears well-developed and well-nourished.  HENT:  Head: Normocephalic and atraumatic.  Eyes: Pupils are equal, round, and reactive to light. EOM are normal.  Cardiovascular: Normal rate and regular rhythm.  Pulmonary/Chest: Effort normal. No respiratory distress.  Genitourinary: Vagina normal and uterus normal.  Genitourinary Comments: Narrow introitus, small bleeding with exam trauma.  Limited view of cervix, technically difficult.   Single finger bimanual exam, normal uterus, no CMT, no adnexal masses.   Neurological: She is alert and oriented to person, place, and time.  Skin: Skin is warm and dry.  Psychiatric: She has a normal mood and affect. Her behavior is normal. Judgment and thought content normal.  Nursing note and vitals reviewed.  Nexplanon Insertion  Patient given informed consent, signed copy in the chart, time out was performed.Virginal patient. Appropriate time out taken.  Patient's left arm was prepped and draped in the usual sterile fashion.. The ruler used to measure and mark insertion area.  Pt was prepped with betadine swab and then injected with 3 cc of 2% lidocaine with epinephrine. Nexplanon removed form packaging,  Device confirmed in needle, then inserted  full length of needle and withdrawn per handbook instructions.  Pt insertion site covered with steri-strip and a bandage.   Minimal blood loss.  Pt tolerated the procedure well.       Assessment/Plan:     22 yo 1. Cervical Cancer screening- pap smear today, limited visualization with smallest speculum 2. Encounter for birth control- Nexplanon placed, follow up in 1 month     [place], [facility] 12/16/17 10:23 AM

## 2017-12-16 NOTE — Patient Instructions (Signed)
Nexplanon Instructions After Insertion  Keep bandage clean and dry for 24 hours  May use ice/Tylenol/Ibuprofen for soreness or pain  If you develop fever, drainage or increased warmth from incision site-contact office immediately   

## 2017-12-20 LAB — CYTOLOGY - PAP: Diagnosis: NEGATIVE

## 2018-01-13 ENCOUNTER — Encounter: Payer: Self-pay | Admitting: Obstetrics and Gynecology

## 2018-01-13 ENCOUNTER — Ambulatory Visit (INDEPENDENT_AMBULATORY_CARE_PROVIDER_SITE_OTHER): Payer: BLUE CROSS/BLUE SHIELD | Admitting: Obstetrics and Gynecology

## 2018-01-13 VITALS — BP 102/58 | HR 83 | Ht 63.5 in | Wt 149.0 lb

## 2018-01-13 DIAGNOSIS — Z3046 Encounter for surveillance of implantable subdermal contraceptive: Secondary | ICD-10-CM

## 2018-01-13 NOTE — Progress Notes (Signed)
  History of Present Illness:  Caitlin Roberts is a 22 y.o. that had a Nexplanon placed approximately 1 month ago. Since that time, she states that she has had pain and abnormal nerve sensations in her arm with movement. It is tender to the touch.   PMHx: She  has a past medical history of Allergy and History of sinusitis. Also,  has a past surgical history that includes Bunionectomy (Left)., family history includes Cancer in her father; Kidney Stones in her father.,  reports that she has never smoked. She has never used smokeless tobacco. She reports that she drinks alcohol. She reports that she does not use drugs. No outpatient medications have been marked as taking for the 01/13/18 encounter (Appointment) with [name], MD.   Current Facility-Administered Medications for the 01/13/18 encounter (Appointment) with [name], MD  Medication  . etonogestrel (NEXPLANON) implant 68 mg  .  Also, is allergic to pork-derived products..  Review of Systems  Constitutional: Negative for chills, fever, malaise/fatigue and weight loss.  HENT: Negative for congestion, hearing loss and sinus pain.   Eyes: Negative for blurred vision and double vision.  Respiratory: Negative for cough, sputum production, shortness of breath and wheezing.   Cardiovascular: Negative for chest pain, palpitations, orthopnea and leg swelling.  Gastrointestinal: Negative for abdominal pain, constipation, diarrhea, nausea and vomiting.  Genitourinary: Negative for dysuria, flank pain, frequency, hematuria and urgency.  Musculoskeletal: Negative for back pain, falls and joint pain.  Skin: Negative for itching and rash.  Neurological: Negative for dizziness and headaches.  Psychiatric/Behavioral: Negative for depression, substance abuse and suicidal ideas. The patient is not nervous/anxious.     Physical Exam:  There were no vitals taken for this visit. There is no height or weight on file to calculate  BMI. Constitutional: Well nourished, well developed female in no acute distress.  Abdomen: diffusely non tender to palpation, non distended, and no masses, hernias Neuro: Grossly intact Psych:  Normal mood and affect.   Extremities: Nexplanon present in left arm. Healed well.    Assessment: Nexplanon present  Plan: She was told to continue to use barrier contraception, in order to prevent any STIs, and to take a home pregnancy test or call us if she ever thinks she may be pregnant, and that her Nexplanon expires in 3 years.  Discussed options for removal or observation. She wouls like to observe the implant for a long time. Will see her back in 2 months if needed. .  A total of 15 minutes were spent face-to-face with the patient during this encounter and over half of that time dealt with counseling and coordination of care.  Adrian Prows MD [facility], [facility] 01/13/18 7:56 AM

## 2018-03-17 ENCOUNTER — Ambulatory Visit: Payer: Self-pay | Admitting: Obstetrics and Gynecology

## 2018-06-21 ENCOUNTER — Ambulatory Visit (INDEPENDENT_AMBULATORY_CARE_PROVIDER_SITE_OTHER): Payer: Managed Care, Other (non HMO) | Admitting: Nurse Practitioner

## 2018-06-21 ENCOUNTER — Encounter: Payer: Self-pay | Admitting: Nurse Practitioner

## 2018-06-21 VITALS — BP 108/66 | HR 97 | Temp 98.7°F | Resp 16 | Ht 63.5 in | Wt 163.3 lb

## 2018-06-21 DIAGNOSIS — J0110 Acute frontal sinusitis, unspecified: Secondary | ICD-10-CM | POA: Diagnosis not present

## 2018-06-21 DIAGNOSIS — J069 Acute upper respiratory infection, unspecified: Secondary | ICD-10-CM | POA: Diagnosis not present

## 2018-06-21 DIAGNOSIS — J302 Other seasonal allergic rhinitis: Secondary | ICD-10-CM | POA: Insufficient documentation

## 2018-06-21 DIAGNOSIS — Z20818 Contact with and (suspected) exposure to other bacterial communicable diseases: Secondary | ICD-10-CM | POA: Diagnosis not present

## 2018-06-21 LAB — POCT RAPID STREP A (OFFICE): Rapid Strep A Screen: NEGATIVE

## 2018-06-21 NOTE — Patient Instructions (Signed)
-   Continue taking theraflu and add on guaifenesin 600-1200mg  every 12 hours to help get out mucous.  - Flonase nasal decongestant spray can help with ear fullness and muffled sounds - If you start to improve and then get a sudden worsening of symptoms or if your symptoms last longer than 10 days please send a message to let us know and we can send you in something else.   You likely have a viral upper respiratory infection (URI) and sinus infection. Antibiotics will not reduce the number of days you are ill or prevent you from getting bacterial rhinosinusitis. A URI can take up to 14 days to resolve, but typically last between 7-11 days. Your body is so smart and strong that it will be fighting this illness off for you but it is important that you drink plenty of fluids, rest. Cover your nose/mouth when you cough or sneeze and wash your hands well and often. Here are some helpful things you can use or pick up over the counter from the pharmacy to help with your symptoms:   For Fever/Pain: Acetaminophen every 6 hours as needed (maximum of 3000mg  a day). If you are still uncomfortable you can add ibuprofen OR naproxen  For coughing: try dextromethorphan for a cough suppressant, and/or a cool mist humidifier, lozenges  For sore throat: saline gargles, honey herbal tea, lozenges, throat spray  To dry out your nose: try an antihistamine like loratadine (non-sedating) or diphenhydramine (sedating) or others To relieve a stuffy nose: try an oral decongestant  Like pseudoephedrine if you are under the age of 42 and do not have high blood pressure, neti pot To make blowing your nose easier and relieve chest congestion: guaifenesin 400mg  every 4-6 hours of guaifenesin ER [phone] mg every 12 hours. Do not take more than 2,400mg  a day.

## 2018-06-21 NOTE — Progress Notes (Signed)
Name: Caitlin Roberts   MRN: [id number]    DOB: 01-May-1995   Date:06/21/2018       Progress Note  Subjective  Chief Complaint  Chief Complaint  Patient presents with  . 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 Cough    yellowish phelgm - very thick to where it gets stuck  . Headache    started on Sunday - temporal  . Itchy ears    bilateral pain on sunday but now it is itchy and full    HPI  Patient endorses headache started Sunday, started to get sore throat Monday morning, coughing up yellow phlegm and bilateral ears feel full and muffled sounds and itchy. Frontal headache behind eyes. Took theraflu and ibuprofen yesterday that provided relief of symptoms yesterday. Has not taken anything today. States child with strep coughed on her earlier.  Denies fevers, chills, shortness of breath.  Patient Active Problem List   Diagnosis Date Noted  . Preventative health care 11/02/2017  . Cervical cancer screening 01/20/2016  . Family hx of colon cancer 01/20/2016  . Encounter for screening for HIV 01/20/2016  . Annual physical exam 12/24/2014  . Lesion of lower eyelid 12/24/2014  . Bunion of great toe of left foot 12/24/2014  . Bunion of great toe of right foot 12/24/2014  . Allergic headache 12/24/2014  . Influenza vaccination declined by patient 12/24/2014  . Bumps on skin 12/24/2014    Past Medical History:  Diagnosis Date  . Allergy   . History of sinusitis     Past Surgical History:  Procedure Laterality Date  . BUNIONECTOMY Left    about 64yrs ago    Social History   Tobacco Use  . Smoking status: Never Smoker  . Smokeless tobacco: Never Used  Substance Use Topics  . Alcohol use: Yes    Alcohol/week: 0.0 standard drinks    Comment: occasionally     Current Outpatient Medications:  Marland Kitchen  Multiple Vitamins-Minerals (ALIVE WOMENS GUMMY) CHEW, Chew by mouth., Disp: , Rfl:   Current  Facility-Administered Medications:  .  etonogestrel (NEXPLANON) implant 68 mg, 68 mg, Subdermal, Once, Schuman, Christanna R, MD  Allergies  Allergen Reactions  . Pork-Derived Products     ROS   No other specific complaints in a complete review of systems (except as listed in HPI above).  Objective  Vitals:   06/21/18 1146  BP: 108/66  Pulse: 97  Resp: 16  Temp: 98.7 F (37.1 C)  TempSrc: Oral  SpO2: 96%  Weight: 163 lb 4.8 oz (74.1 kg)  Height: 5' 3.5" (1.613 m)    Body mass index is 28.47 kg/m.  Nursing Note and Vital Signs reviewed.  Physical Exam HENT:     Head: Normocephalic and atraumatic.     Right Ear: Hearing, tympanic membrane, ear canal and external ear normal.     Left Ear: Hearing, tympanic membrane, ear canal and external ear normal.     Nose: Congestion present.     Right Sinus: Frontal sinus tenderness present. No maxillary sinus tenderness.     Left Sinus: Frontal sinus tenderness present. No maxillary sinus tenderness.     Mouth/Throat:     Mouth: Mucous membranes are moist.     Pharynx: Uvula midline. Posterior oropharyngeal erythema present. No pharyngeal swelling, oropharyngeal exudate or uvula swelling.  Eyes:     General:  Right eye: No discharge.        Left eye: No discharge.     Conjunctiva/sclera: Conjunctivae normal.  Neck:     Musculoskeletal: Normal range of motion.  Cardiovascular:     Rate and Rhythm: Normal rate.  Pulmonary:     Effort: Pulmonary effort is normal.     Breath sounds: Normal breath sounds.  Lymphadenopathy:     Cervical: No cervical adenopathy.  Skin:    General: Skin is warm and dry.     Findings: No rash.  Neurological:     Mental Status: She is alert.  Psychiatric:        Judgment: Judgment normal.      No results found for this or any previous visit (from the past 48 hour(s)).  Assessment & Plan  1. Acute non-recurrent frontal sinusitis - Continue taking theraflu and add on guaifenesin  600-1200mg  every 12 hours to help get out mucous.  - Flonase nasal decongestant spray can help with ear fullness and muffled sounds - If you start to improve and then get a sudden worsening of symptoms or if your symptoms last longer than 10 days please send a message to let us know and we can send you in something else.   2. Upper respiratory virus Discussed OTC management  3. Exposure to strep throat Negative,  - POCT rapid strep A

## 2018-08-18 ENCOUNTER — Ambulatory Visit: Payer: Self-pay | Admitting: Family Medicine

## 2018-08-18 NOTE — Telephone Encounter (Signed)
Called patient back states feeling better, but informed her to go to urgent care.

## 2018-08-18 NOTE — Telephone Encounter (Signed)
Pt called in c/o pain on her left side near her sternum that goes up to her collarbone.    She works at [employer] and was tested for the COVID-19 virus and it was negative.   At that time she had fever.   I have attempted to warm transfer the pt to the office however the number is busy.   I'm sending these notes high priority to Dr. Delight Ovens office.   Reason for Disposition . [1] MILD difficulty breathing (e.g., minimal/no SOB at rest, SOB with walking, pulse <100) AND [2] NEW-onset or WORSE than normal  Answer Assessment - Initial Assessment Questions 1. RESPIRATORY STATUS: "Describe your breathing?" (e.g., wheezing, shortness of breath, unable to speak, severe coughing)      My left side near sternum comes up to my collarbone.   At first it was pressure.   From laying to getting up it feels like a pinch.   It comes and goes.   I feel pressure when I lay down. 2. ONSET: "When did this breathing problem begin?"      The chest pain happened yesterday at work.   At the end of the day it hurt at the end of the day.   I took a Ibuprofen and it helped.   Today it's constant whereas yesterday it was intermittent. 3. PATTERN "Does the difficult breathing come and go, or has it been constant since it started?"      Constant now. 4. SEVERITY: "How bad is your breathing?" (e.g., mild, moderate, severe)    - MILD: No SOB at rest, mild SOB with walking, speaks normally in sentences, can lay down, no retractions, pulse < 100.    - MODERATE: SOB at rest, SOB with minimal exertion and prefers to sit, cannot lie down flat, speaks in phrases, mild retractions, audible wheezing, pulse 100-120.    - SEVERE: Very SOB at rest, speaks in single words, struggling to breathe, sitting hunched forward, retractions, pulse > 120      Exertion not bother me.   It's the position I'm in.   I'm short of breath when I exercise.  Lifting a 10 lb dumbbell I'm out of breath. 5. RECURRENT SYMPTOM: "Have you had difficulty  breathing before?" If so, ask: "When was the last time?" and "What happened that time?"      I had asthma as a kid.   I used albuterol as a teenager.   But no current asthma medications. 6. CARDIAC HISTORY: "Do you have any history of heart disease?" (e.g., heart attack, angina, bypass surgery, angioplasty)      No 7. LUNG HISTORY: "Do you have any history of lung disease?"  (e.g., pulmonary embolus, asthma, emphysema)     See above. 8. CAUSE: "What do you think is causing the breathing problem?"      I thought it was a panic attack but now I don't think so.   I'm trying to exercise more.   Go walking.   I don't really know.   I thought maybe it was allergies.       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    9. OTHER SYMPTOMS: "Do you have any other symptoms? (e.g., dizziness, runny nose, cough, chest pain, fever)     I was tested for COVID-19 at my job at the hospital and it was negative.   I was having symptoms at that time.   No fever since Friday.    10.  PREGNANCY: "Is there any chance you are pregnant?" "When was your last menstrual period?"       I don't think so.   I have the implant in my arm. 11. TRAVEL: "Have you traveled out of the country in the last month?" (e.g., travel history, exposures)       No travels.   I've not had exposure to anyone that has ruled positive.  Protocols used: BREATHING DIFFICULTY-A-AH

## 2018-08-18 NOTE — Telephone Encounter (Addendum)
I am going to recommend that she go to Urgent Care NOW please

## 2018-08-18 NOTE — Telephone Encounter (Signed)
Left another voice message for patient.  This time this message was to inform her to an Urgent Care now per Dr. Delight Ovens recommendation.  And also to contact our office to let us know that she received the message.

## 2018-08-18 NOTE — Telephone Encounter (Signed)
LVM for patient to call our office back so that we can schedule a virtual appt with one of our providers for today.

## 2018-11-08 ENCOUNTER — Encounter: Payer: Managed Care, Other (non HMO) | Admitting: Family Medicine

## 2019-01-25 ENCOUNTER — Telehealth: Payer: Self-pay | Admitting: Family Medicine

## 2019-01-25 DIAGNOSIS — Z800 Family history of malignant neoplasm of digestive organs: Secondary | ICD-10-CM

## 2019-01-25 NOTE — Telephone Encounter (Addendum)
Per Dr. Delight Ovens notes, the patient should have a screening colonoscopy when she turns 23 due to her father's history of CRC diagnosed at age 25.  I am placing referral now.

## 2019-01-25 NOTE — Telephone Encounter (Signed)
LVM message informing patient that a referral to GI was placed for her to have a screening colonscopy because of her family history.

## 2019-02-13 ENCOUNTER — Encounter: Payer: Self-pay | Admitting: *Deleted

## 2019-08-08 ENCOUNTER — Ambulatory Visit (INDEPENDENT_AMBULATORY_CARE_PROVIDER_SITE_OTHER): Payer: 59 | Admitting: Family Medicine

## 2019-08-08 ENCOUNTER — Encounter: Payer: Self-pay | Admitting: Family Medicine

## 2019-08-08 ENCOUNTER — Other Ambulatory Visit: Payer: Self-pay

## 2019-08-08 VITALS — BP 116/78 | HR 100 | Temp 98.5°F | Resp 14 | Ht 64.0 in | Wt 169.4 lb

## 2019-08-08 DIAGNOSIS — Z0000 Encounter for general adult medical examination without abnormal findings: Secondary | ICD-10-CM | POA: Diagnosis not present

## 2019-08-08 DIAGNOSIS — Z800 Family history of malignant neoplasm of digestive organs: Secondary | ICD-10-CM

## 2019-08-08 NOTE — Progress Notes (Signed)
Patient: Caitlin Roberts, Female    DOB: 1995-05-10, 24 y.o.   MRN: [id number] Delsa Grana, PA-C Visit Date: 08/08/2019  Today's Provider: Delsa Grana, PA-C   Chief Complaint  Patient presents with  . Annual Exam   Subjective:   Annual physical exam:  Caitlin Roberts is a 24 y.o. female who presents today for complete physical exam:   Exercise/Activity:  She is working on it - with winter and covid and working she was not exercising as much as she would like to - goal right now is to exercise 3x a week for 60 min  Diet/nutrition:  Cutting out fast food, limiting fats and she has a target for certain amount of protein with chicken fish and veggies  Sleep:  Sleeping well   Irregular periods, spotting some past menses that were heavy or long, nexplanon put in 2 years ago  Prior OBGYN is not in network either needs new referral  Pt father has hx of CRC at age 55, she was recommended to start screening at 60, needs GI referral, was referred before but practice was not in network  USPSTF grade A and B recommendations - reviewed and addressed today  Depression:  Phq 9 completed today by patient, was reviewed by me with patient in the room PHQ score is neg, pt feels good, denies any anxiety either PHQ 2/9 Scores 08/08/2019 06/21/2018 11/02/2017 01/20/2016  PHQ - 2 Score 0 0 0 0  PHQ- 9 Score 0 - - -   Depression screen [facility] 2/9 08/08/2019 06/21/2018 11/02/2017 01/20/2016 12/24/2014  Decreased Interest 0 0 0 0 0  Down, Depressed, Hopeless 0 0 0 0 0  PHQ - 2 Score 0 0 0 0 0  Altered sleeping 0 - - - -  Tired, decreased energy 0 - - - -  Change in appetite 0 - - - -  Feeling bad or failure about yourself  0 - - - -  Trouble concentrating 0 - - - -  Moving slowly or fidgety/restless 0 - - - -  Suicidal thoughts 0 - - - -  PHQ-9 Score 0 - - - -  Difficult doing work/chores Not difficult at all - - - -    Alcohol screening:   Office Visit from 08/08/2019 in [facility]  AUDIT-C Score  1      Immunizations and Health Maintenance: Health Maintenance  Topic Date Due  . INFLUENZA VACCINE  11/25/2019  . PAP-Cervical Cytology Screening  12/16/2020  . PAP SMEAR-Modifier  12/16/2020  . TETANUS/TDAP  04/27/2027  . HIV Screening  Completed     Hep C Screening:  n/a  STD testing and prevention (HIV/chl/gon/syphilis):  see above, no additional testing desired by pt today  Intimate partner violence:  Safe, denies abuse  Sexual History/Pain during Intercourse: Married  Menstrual History/LMP/Abnormal Bleeding: see above Patient's last menstrual period was 07/26/2019.  Incontinence Symptoms:   none  Breast cancer:  Last Mammogram: see HM list above BRCA gene screening:  None done in the past none indicated  Cervical cancer screening: utd 2019 Pt has + family hx of cancers - breast, ovarian, uterine, colon:    Dad with CRC, no other known family hx  Osteoporosis:   N/a   Skin cancer:  Hx of skin CA -  NO Discussed atypical lesions   Colorectal cancer:   Colonoscopy - she states she would like to do Discussed concerning signs and sx of CRC, pt  denies melena, hematochezia, change in BM or caliber  Lung cancer:   Low Dose CT Chest recommended if Age 61-80 years, 30 pack-year currently smoking OR have quit w/in 15years. Patient does not qualify.    Social History   Tobacco Use  . Smoking status: Never Smoker  . Smokeless tobacco: Never Used  Substance Use Topics  . Alcohol use: Yes    Alcohol/week: 0.0 standard drinks    Comment: occasionally  . Drug use: No       Office Visit from 08/08/2019 in [facility]  AUDIT-C Score  1      Family History  Problem Relation Age of Onset  . Cancer Father        colon  . Kidney Stones Father      Blood pressure/Hypertension: BP Readings from Last 3 Encounters:  08/08/19 116/78  06/21/18 108/66  01/13/18 (!) 102/58    Weight/Obesity: Wt Readings from Last 3  Encounters:  08/08/19 169 lb 6.4 oz (76.8 kg)  06/21/18 163 lb 4.8 oz (74.1 kg)  01/13/18 149 lb (67.6 kg)   BMI Readings from Last 3 Encounters:  08/08/19 29.08 kg/m  06/21/18 28.47 kg/m  01/13/18 25.98 kg/m     Lipids:  Lab Results  Component Value Date   CHOL 162 01/20/2016   Lab Results  Component Value Date   HDL 49 01/20/2016   Lab Results  Component Value Date   LDLCALC 98 01/20/2016   Lab Results  Component Value Date   TRIG 73 01/20/2016   Lab Results  Component Value Date   CHOLHDL 3.3 01/20/2016   No results found for: LDLDIRECT Based on the results of lipid panel his/her cardiovascular risk factor ( using [place] )  in the next 10 years is: The ASCVD Risk score Mikey Bussing DC Jr., et al., 2013) failed to calculate for the following reasons:   The 2013 ASCVD risk score is only valid for ages 4 to 38 Glucose:  Glucose, Bld  Date Value Ref Range Status  01/20/2016 85 65 - 99 mg/dL Final   Hypertension: BP Readings from Last 3 Encounters:  08/08/19 116/78  06/21/18 108/66  01/13/18 (!) 102/58   Obesity: Wt Readings from Last 3 Encounters:  08/08/19 169 lb 6.4 oz (76.8 kg)  06/21/18 163 lb 4.8 oz (74.1 kg)  01/13/18 149 lb (67.6 kg)   BMI Readings from Last 3 Encounters:  08/08/19 29.08 kg/m  06/21/18 28.47 kg/m  01/13/18 25.98 kg/m      Advanced Care Planning:  A voluntary discussion about advance care planning including the explanation and discussion of advance directives.   Discussed health care proxy and Living will, and the patient was able to identify a health care proxy as husband Madalyn Rob Patient does not have a living will at present time.   Social History      She        Social History   Socioeconomic History  . Marital status: Married    Spouse name: Rosanne Gutting  . Number of children: 0  . Years of education: Not on file  . Highest education level: Bachelor's degree (e.g., BA, AB, BS)  Occupational History  .  Occupation: Engineer, materials  Tobacco Use  . Smoking status: Never Smoker  . Smokeless tobacco: Never Used  Substance and Sexual Activity  . Alcohol use: Yes    Alcohol/week: 0.0 standard drinks    Comment: occasionally  . Drug use: No  . Sexual activity: Yes  Partners: Male    Birth control/protection: Implant, Condom    Comment: Nexplanon   Other Topics Concern  . Not on file  Social History Narrative   Patient recently got married in 01/2018. Name will be changing to [place].   Social Determinants of Health   Financial Resource Strain:   . Difficulty of Paying Living Expenses:   Food Insecurity:   . Worried About Charity fundraiser in the Last Year:   . Arboriculturist in the Last Year:   Transportation Needs:   . Film/video editor (Medical):   Marland Kitchen Lack of Transportation (Non-Medical):   Physical Activity:   . Days of Exercise per Week:   . Minutes of Exercise per Session:   Stress:   . Feeling of Stress :   Social Connections:   . Frequency of Communication with Friends and Family:   . Frequency of Social Gatherings with Friends and Family:   . Attends Religious Services:   . Active Member of Clubs or Organizations:   . Attends Archivist Meetings:   Marland Kitchen Marital Status:     Family History        Family History  Problem Relation Age of Onset  . Cancer Father        colon  . Kidney Stones Father     Patient Active Problem List   Diagnosis Date Noted  . Seasonal allergies 06/21/2018  . Preventative health care 11/02/2017  . Cervical cancer screening 01/20/2016  . Family hx of colon cancer 01/20/2016  . Encounter for screening for HIV 01/20/2016  . Annual physical exam 12/24/2014  . Lesion of lower eyelid 12/24/2014  . Bunion of great toe of left foot 12/24/2014  . Bunion of great toe of right foot 12/24/2014  . Allergic headache 12/24/2014  . Influenza vaccination declined by patient 12/24/2014  . Bumps on skin 12/24/2014    Past  Surgical History:  Procedure Laterality Date  . BUNIONECTOMY Left    about 57yr ago    No current outpatient medications on file.  Current Facility-Administered Medications:  .  etonogestrel (NEXPLANON) implant 68 mg, 68 mg, Subdermal, Once, Schuman, Christanna R, MD  Allergies  Allergen Reactions  . Pork-Derived Products     Patient Care Team: TDelsa Grana PA-C as PCP - General (Family Medicine)  Review of Systems  Constitutional: Negative.  Negative for activity change, appetite change, fatigue and unexpected weight change.  HENT: Negative.   Eyes: Negative.   Respiratory: Negative.  Negative for shortness of breath.   Cardiovascular: Negative.  Negative for chest pain, palpitations and leg swelling.  Gastrointestinal: Negative.  Negative for abdominal pain and blood in stool.  Endocrine: Negative.   Genitourinary: Negative.   Musculoskeletal: Negative.  Negative for arthralgias, gait problem, joint swelling and myalgias.  Skin: Negative.  Negative for color change, pallor and rash.  Allergic/Immunologic: Negative.   Neurological: Negative.  Negative for syncope and weakness.  Hematological: Negative.   Psychiatric/Behavioral: Negative.  Negative for confusion, dysphoric mood, self-injury and suicidal ideas. The patient is not nervous/anxious.            Objective:   Vitals:  Vitals:   08/08/19 0854  BP: 116/78  Pulse: 100  Resp: 14  Temp: 98.5 F (36.9 C)  SpO2: 98%  Weight: 169 lb 6.4 oz (76.8 kg)  Height: '5\' 4"'  (1.626 m)    Body mass index is 29.08 kg/m.  Physical Exam Vitals and nursing note reviewed.  Constitutional:      General: She is not in acute distress.    Appearance: Normal appearance. She is well-developed. She is not ill-appearing, toxic-appearing or diaphoretic.     Interventions: Face mask in place.  HENT:     Head: Normocephalic and atraumatic.     Right Ear: External ear normal.     Left Ear: External ear normal.  Eyes:      General: Lids are normal. No scleral icterus.       Right eye: No discharge.        Left eye: No discharge.     Conjunctiva/sclera: Conjunctivae normal.  Neck:     Thyroid: No thyroid mass, thyromegaly or thyroid tenderness.     Trachea: Phonation normal. No tracheal deviation.  Cardiovascular:     Rate and Rhythm: Normal rate and regular rhythm.     Pulses: Normal pulses.          Radial pulses are 2+ on the right side and 2+ on the left side.       Posterior tibial pulses are 2+ on the right side and 2+ on the left side.     Heart sounds: Normal heart sounds. No murmur. No friction rub. No gallop.   Pulmonary:     Effort: Pulmonary effort is normal. No respiratory distress.     Breath sounds: Normal breath sounds. No stridor. No wheezing, rhonchi or rales.  Chest:     Chest wall: No tenderness.  Abdominal:     General: Bowel sounds are normal. There is no distension.     Palpations: Abdomen is soft.     Tenderness: There is no abdominal tenderness. There is no guarding or rebound.  Musculoskeletal:        General: No deformity. Normal range of motion.     Cervical back: Normal range of motion and neck supple.     Right lower leg: No edema.     Left lower leg: No edema.  Lymphadenopathy:     Cervical: No cervical adenopathy.  Skin:    General: Skin is warm and dry.     Capillary Refill: Capillary refill takes less than 2 seconds.     Coloration: Skin is not jaundiced or pale.     Findings: No rash.  Neurological:     Mental Status: She is alert and oriented to person, place, and time.     Motor: No abnormal muscle tone.     Gait: Gait normal.  Psychiatric:        Mood and Affect: Mood normal.        Speech: Speech normal.        Behavior: Behavior normal.       Fall Risk: Fall Risk  08/08/2019 06/21/2018 11/02/2017 01/20/2016 12/24/2014  Falls in the past year? 0 0 No Yes No  Number falls in past yr: 0 0 - 1 -  Injury with Fall? 0 0 - No -    Functional Status  Survey: Is the patient deaf or have difficulty hearing?: No Does the patient have difficulty seeing, even when wearing glasses/contacts?: Yes Does the patient have difficulty concentrating, remembering, or making decisions?: No Does the patient have difficulty walking or climbing stairs?: No Does the patient have difficulty dressing or bathing?: No Does the patient have difficulty doing errands alone such as visiting a doctor's office or shopping?: No   Assessment & Plan:    CPE completed today  . USPSTF grade A and B recommendations  reviewed with patient; age-appropriate recommendations, preventive care, screening tests, etc discussed and encouraged; healthy living encouraged; see AVS for patient education given to patient  . Discussed importance of 150 minutes of physical activity weekly, AHA exercise recommendations given to pt in AVS/handout  . Discussed importance of healthy diet:  eating lean meats and proteins, avoiding trans fats and saturated fats, avoid simple sugars and excessive carbs in diet, eat 6 servings of fruit/vegetables daily and drink plenty of water and avoid sweet beverages.    . Recommended pt to do annual eye exam and routine dental exams/cleanings  . Depression, alcohol, fall screening completed as documented above and per flowsheets  . Reviewed Health Maintenance: Health Maintenance  Topic Date Due  . INFLUENZA VACCINE  11/25/2019  . PAP-Cervical Cytology Screening  12/16/2020  . PAP SMEAR-Modifier  12/16/2020  . TETANUS/TDAP  04/27/2027  . HIV Screening  Completed    . Immunizations: Immunization History  Administered Date(s) Administered  . Influenza-Unspecified 07/15/2017, 01/12/2018, 12/27/2018  . Tdap 04/26/2017      ICD-10-CM   1. Adult general medical exam  Z00.00 CBC with Differential/Platelet    COMPLETE METABOLIC PANEL WITH GFR    Lipid panel  2. Family history of colon cancer in father  Z80.0 Ambulatory referral to Gastroenterology    CA in dad at age 5, pt was advised to screen 45 y/o      Delsa Grana, PA-C 08/08/19 9:12 AM  [facility]

## 2019-08-08 NOTE — Patient Instructions (Signed)
Preventive Care 21-26 Years Old, Female Preventive care refers to visits with your health care provider and lifestyle choices that can promote health and wellness. This includes:  A yearly physical exam. This may also be called an annual well check.  Regular dental visits and eye exams.  Immunizations.  Screening for certain conditions.  Healthy lifestyle choices, such as eating a healthy diet, getting regular exercise, not using drugs or products that contain nicotine and tobacco, and limiting alcohol use. What can I expect for my preventive care visit? Physical exam Your health care provider will check your:  Height and weight. This may be used to calculate body mass index (BMI), which tells if you are at a healthy weight.  Heart rate and blood pressure.  Skin for abnormal spots. Counseling Your health care provider may ask you questions about your:  Alcohol, tobacco, and drug use.  Emotional well-being.  Home and relationship well-being.  Sexual activity.  Eating habits.  Work and work environment.  Method of birth control.  Menstrual cycle.  Pregnancy history. What immunizations do I need?  Influenza (flu) vaccine  This is recommended every year. Tetanus, diphtheria, and pertussis (Tdap) vaccine  You may need a Td booster every 10 years. Varicella (chickenpox) vaccine  You may need this if you have not been vaccinated. Human papillomavirus (HPV) vaccine  If recommended by your health care provider, you may need three doses over 6 months. Measles, mumps, and rubella (MMR) vaccine  You may need at least one dose of MMR. You may also need a second dose. Meningococcal conjugate (MenACWY) vaccine  One dose is recommended if you are age 19-21 years and a first-year college student living in a residence hall, or if you have one of several medical conditions. You may also need additional booster doses. Pneumococcal conjugate (PCV13) vaccine  You may need  this if you have certain conditions and were not previously vaccinated. Pneumococcal polysaccharide (PPSV23) vaccine  You may need one or two doses if you smoke cigarettes or if you have certain conditions. Hepatitis A vaccine  You may need this if you have certain conditions or if you travel or work in places where you may be exposed to hepatitis A. Hepatitis B vaccine  You may need this if you have certain conditions or if you travel or work in places where you may be exposed to hepatitis B. Haemophilus influenzae type b (Hib) vaccine  You may need this if you have certain conditions. You may receive vaccines as individual doses or as more than one vaccine together in one shot (combination vaccines). Talk with your health care provider about the risks and benefits of combination vaccines. What tests do I need?  Blood tests  Lipid and cholesterol levels. These may be checked every 5 years starting at age 20.  Hepatitis C test.  Hepatitis B test. Screening  Diabetes screening. This is done by checking your blood sugar (glucose) after you have not eaten for a while (fasting).  Sexually transmitted disease (STD) testing.  BRCA-related cancer screening. This may be done if you have a family history of breast, ovarian, tubal, or peritoneal cancers.  Pelvic exam and Pap test. This may be done every 3 years starting at age 21. Starting at age 30, this may be done every 5 years if you have a Pap test in combination with an HPV test. Talk with your health care provider about your test results, treatment options, and if necessary, the need for more tests.   Follow these instructions at home: Eating and drinking   Eat a diet that includes fresh fruits and vegetables, whole grains, lean protein, and low-fat dairy.  Take vitamin and mineral supplements as recommended by your health care provider.  Do not drink alcohol if: ? Your health care provider tells you not to drink. ? You are  pregnant, may be pregnant, or are planning to become pregnant.  If you drink alcohol: ? Limit how much you have to 0-1 drink a day. ? Be aware of how much alcohol is in your drink. In the [place] one drink equals one 12 oz bottle of beer (355 mL), one 5 oz glass of wine (148 mL), or one 1 oz glass of hard liquor (44 mL). Lifestyle  Take daily care of your teeth and gums.  Stay active. Exercise for at least 30 minutes on 5 or more days each week.  Do not use any products that contain nicotine or tobacco, such as cigarettes, e-cigarettes, and chewing tobacco. If you need help quitting, ask your health care provider.  If you are sexually active, practice safe sex. Use a condom or other form of birth control (contraception) in order to prevent pregnancy and STIs (sexually transmitted infections). If you plan to become pregnant, see your health care provider for a preconception visit. What's next?  Visit your health care provider once a year for a well check visit.  Ask your health care provider how often you should have your eyes and teeth checked.  Stay up to date on all vaccines. This information is not intended to replace advice given to you by your health care provider. Make sure you discuss any questions you have with your health care provider. Document Revised: 12/22/2017 Document Reviewed: 12/22/2017 Elsevier Patient Education  2020 [employer].

## 2019-08-09 LAB — COMPLETE METABOLIC PANEL WITH GFR
AG Ratio: 2 (calc) (ref 1.0–2.5)
ALT: 19 U/L (ref 6–29)
AST: 18 U/L (ref 10–30)
Albumin: 4.5 g/dL (ref 3.6–5.1)
Alkaline phosphatase (APISO): 62 U/L (ref 31–125)
BUN: 11 mg/dL (ref 7–25)
CO2: 28 mmol/L (ref 20–32)
Calcium: 9.1 mg/dL (ref 8.6–10.2)
Chloride: 105 mmol/L (ref 98–110)
Creat: 0.61 mg/dL (ref 0.50–1.10)
GFR, Est African American: 146 mL/min/{1.73_m2} (ref >=60)
GFR, Est Non African American: 126 mL/min/{1.73_m2} (ref >=60)
Globulin: 2.3 g/dL (calc) (ref 1.9–3.7)
Glucose, Bld: 89 mg/dL (ref 65–99)
Potassium: 4 mmol/L (ref 3.5–5.3)
Sodium: 138 mmol/L (ref 135–146)
Total Bilirubin: 0.5 mg/dL (ref 0.2–1.2)
Total Protein: 6.8 g/dL (ref 6.1–8.1)

## 2019-08-09 LAB — CBC WITH DIFFERENTIAL/PLATELET
Absolute Monocytes: 509 cells/uL (ref 200–950)
Basophils Absolute: 40 cells/uL (ref 0–200)
Basophils Relative: 0.6 %
Eosinophils Absolute: 60 cells/uL (ref 15–500)
Eosinophils Relative: 0.9 %
HCT: 42.7 % (ref 35.0–45.0)
Hemoglobin: 14.5 g/dL (ref 11.7–15.5)
Lymphs Abs: 2479 cells/uL (ref 850–3900)
MCH: 30.5 pg (ref 27.0–33.0)
MCHC: 34 g/dL (ref 32.0–36.0)
MCV: 89.7 fL (ref 80.0–100.0)
MPV: 10 fL (ref 7.5–12.5)
Monocytes Relative: 7.6 %
Neutro Abs: 3611 cells/uL (ref 1500–7800)
Neutrophils Relative %: 53.9 %
Platelets: 399 10*3/uL (ref 140–400)
RBC: 4.76 10*6/uL (ref 3.80–5.10)
RDW: 12.7 % (ref 11.0–15.0)
Total Lymphocyte: 37 %
WBC: 6.7 10*3/uL (ref 3.8–10.8)

## 2019-08-09 LAB — LIPID PANEL
Cholesterol: 225 mg/dL — ABNORMAL HIGH (ref <200)
HDL: 45 mg/dL — ABNORMAL LOW (ref >=50)
LDL Cholesterol (Calc): 158 mg/dL (calc) — ABNORMAL HIGH
Non-HDL Cholesterol (Calc): 180 mg/dL (calc) — ABNORMAL HIGH (ref <130)
Total CHOL/HDL Ratio: 5 (calc) — ABNORMAL HIGH (ref <5.0)
Triglycerides: 108 mg/dL (ref <150)

## 2020-01-25 ENCOUNTER — Ambulatory Visit: Payer: 59 | Admitting: Family Medicine

## 2020-03-13 ENCOUNTER — Telehealth: Payer: Self-pay | Admitting: *Deleted

## 2020-03-13 NOTE — Telephone Encounter (Signed)
"  I had surgery done by a doctor there, twice.  I'm calling to see if I have any metal in my body."

## 2020-03-14 NOTE — Telephone Encounter (Signed)
Returned call-  L/M that appears the last surgery she had done in our office was 09/2015 and it was to removed internal fixation. I did review last xray and no fixation shown.

## 2020-08-08 ENCOUNTER — Encounter: Payer: 59 | Admitting: Family Medicine

## 2020-08-16 ENCOUNTER — Encounter: Payer: Self-pay | Admitting: Obstetrics and Gynecology

## 2020-09-10 ENCOUNTER — Telehealth: Payer: Self-pay

## 2020-09-10 NOTE — Telephone Encounter (Signed)
Can you do this without an appt?

## 2020-09-10 NOTE — Telephone Encounter (Signed)
Copied from [place] [id number]. Topic: Referral - Request for Referral >> Sep 10, 2020 12:24 PM Tessa Lerner A wrote: Has patient seen PCP for this complaint? No  *If NO, is insurance requiring patient see PCP for this issue before PCP can refer them?  Referral for which specialty: Gastroenterology   Preferred provider/office: Nadyne Coombes / [facility]   Reason for referral: Patient would like to have a colonoscopy procedure due to their family medical history

## 2020-09-18 ENCOUNTER — Encounter: Payer: Self-pay | Admitting: Unknown Physician Specialty

## 2020-09-18 ENCOUNTER — Telehealth (INDEPENDENT_AMBULATORY_CARE_PROVIDER_SITE_OTHER): Payer: 59 | Admitting: Unknown Physician Specialty

## 2020-09-18 VITALS — Ht 64.0 in | Wt 172.0 lb

## 2020-09-18 DIAGNOSIS — K580 Irritable bowel syndrome with diarrhea: Secondary | ICD-10-CM | POA: Diagnosis not present

## 2020-09-18 DIAGNOSIS — Z800 Family history of malignant neoplasm of digestive organs: Secondary | ICD-10-CM | POA: Diagnosis not present

## 2020-09-18 NOTE — Assessment & Plan Note (Signed)
Refer for a colonoscopy due to family history

## 2020-09-18 NOTE — Progress Notes (Addendum)
   Ht 5\' 4"  (1.626 m)   Wt 172 lb (78 kg)   LMP 09/17/2020   BMI 29.52 kg/m    Subjective:    Patient ID: Caitlin Roberts, female    DOB: [birth year], 25 y.o.   MRN: [id number]  HPI: Caitlin Roberts is a 25 y.o. female  Chief Complaint  Patient presents with  . Referral    GI needs scn colon. Father dx with colon cancer at age 29   This visit was completed via telephone due to the restrictions of the COVID-19 pandemic. All issues as above were discussed and addressed but no physical exam was performed. If it was felt that the patient should be evaluated in the office, they were directed there. The patient verbally consented to this visit. Patient was unable to complete an audio/visual visit due to Technical difficulties. . Location of the patient: home . Location of the provider: work . Those involved with this call:  . Provider: Kathrine Haddock FNP . Time spent on call: 10 minutes on the phone discussing health concerns. 5 minutes total spent in review of patient's record and preparation of their chart.  I verified patient identity using two factors (patient name and date of birth). Patient consents verbally to being seen via telemedicine visit today.   Pt with a family history of colon cancer as above.  Needs a referral for a colonoscopy.  Pt states she feels sick after everything she eats.  No constipation, intermittent diarrhea with certain foods such as fired foods.  No weight loss.  No vomiting.    Relevant past medical, surgical, family and social history reviewed and updated as indicated. Interim medical history since our last visit reviewed. Allergies and medications reviewed and updated.  Review of Systems  Per HPI unless specifically indicated above     Objective:    Ht 5\' 4"  (1.626 m)   Wt 172 lb (78 kg)   LMP 09/17/2020   BMI 29.52 kg/m   Wt Readings from Last 3 Encounters:  09/18/20 172 lb (78 kg)  08/08/19 169 lb 6.4 oz (76.8 kg)  06/21/18 163 lb 4.8 oz (74.1  kg)    Physical Exam Neurological:     Mental Status: She is alert and oriented to person, place, and time.  Psychiatric:        Mood and Affect: Mood normal.      Assessment & Plan:   Problem List Items Addressed This Visit      Unprioritized   Family hx of colon cancer    Refer for a colonoscopy due to family history      Relevant Orders   Ambulatory referral to Gastroenterology    Other Visit Diagnoses    Irritable bowel syndrome with diarrhea    -  Primary   Probable IBS Will come come to clinic following colonoscopy.         Follow up plan: Return if symptoms worsen or fail to improve.

## 2020-10-09 ENCOUNTER — Telehealth: Payer: Self-pay

## 2020-10-09 NOTE — Telephone Encounter (Signed)
Copied from [place] [id number]. Topic: Referral - Request for Referral >> Oct 09, 2020 10:34 AM Bayard Beaver wrote: Has patient seen PCP for this complaint? Yes.   *If NO, is insurance requiring patient see PCP for this issue before PCP can refer them? Referral for which specialty: gastroenterology Preferred provider/office: [facility] [place] Reason for referral: original referral to Dr Allen Norris is out of network.   Please call back

## 2020-11-03 ENCOUNTER — Ambulatory Visit: Payer: 59 | Admitting: Gastroenterology

## 2021-02-17 ENCOUNTER — Other Ambulatory Visit: Payer: Self-pay

## 2021-02-17 ENCOUNTER — Ambulatory Visit: Payer: 59 | Admitting: Anesthesiology

## 2021-02-17 ENCOUNTER — Encounter: Payer: Self-pay | Admitting: *Deleted

## 2021-02-17 ENCOUNTER — Ambulatory Visit
Admission: RE | Admit: 2021-02-17 | Discharge: 2021-02-17 | Disposition: A | Discharge status: Home / Self Care | Payer: 59 | Attending: Gastroenterology | Admitting: Gastroenterology

## 2021-02-17 ENCOUNTER — Encounter
Admission: RE | Disposition: A | Discharge status: Home / Self Care | Payer: Self-pay | Source: Home / Self Care | Attending: Gastroenterology

## 2021-02-17 DIAGNOSIS — Z800 Family history of malignant neoplasm of digestive organs: Secondary | ICD-10-CM | POA: Insufficient documentation

## 2021-02-17 DIAGNOSIS — Z6830 Body mass index (BMI) 30.0-30.9, adult: Secondary | ICD-10-CM | POA: Insufficient documentation

## 2021-02-17 DIAGNOSIS — D125 Benign neoplasm of sigmoid colon: Secondary | ICD-10-CM | POA: Diagnosis not present

## 2021-02-17 DIAGNOSIS — Z79899 Other long term (current) drug therapy: Secondary | ICD-10-CM | POA: Insufficient documentation

## 2021-02-17 DIAGNOSIS — Z91014 Allergy to mammalian meats: Secondary | ICD-10-CM | POA: Diagnosis not present

## 2021-02-17 DIAGNOSIS — E669 Obesity, unspecified: Secondary | ICD-10-CM | POA: Insufficient documentation

## 2021-02-17 DIAGNOSIS — Z1211 Encounter for screening for malignant neoplasm of colon: Secondary | ICD-10-CM | POA: Diagnosis present

## 2021-02-17 HISTORY — PX: COLONOSCOPY WITH PROPOFOL: SHX5780

## 2021-02-17 LAB — HM COLONOSCOPY

## 2021-02-17 LAB — POCT PREGNANCY, URINE: Preg Test, Ur: NEGATIVE

## 2021-02-17 SURGERY — COLONOSCOPY WITH PROPOFOL
Anesthesia: General

## 2021-02-17 MED ORDER — LIDOCAINE HCL (PF) 2 % IJ SOLN
INTRAMUSCULAR | Status: AC
  Filled 2021-02-17: qty 5

## 2021-02-17 MED ORDER — SODIUM CHLORIDE 0.9 % IV SOLN: INTRAVENOUS | Status: DC

## 2021-02-17 MED ORDER — PROPOFOL 500 MG/50ML IV EMUL
INTRAVENOUS | Status: DC | PRN
  Administered 2021-02-17: 130 ug/kg/min via INTRAVENOUS

## 2021-02-17 MED ORDER — DEXMEDETOMIDINE HCL IN NACL 200 MCG/50ML IV SOLN
INTRAVENOUS | Status: DC | PRN
  Administered 2021-02-17 (×2): 8 ug via INTRAVENOUS

## 2021-02-17 MED ORDER — PROPOFOL 10 MG/ML IV BOLUS
INTRAVENOUS | Status: AC
  Filled 2021-02-17: qty 20

## 2021-02-17 MED ORDER — PROPOFOL 10 MG/ML IV BOLUS
INTRAVENOUS | Status: DC | PRN
  Administered 2021-02-17: 100 mg via INTRAVENOUS

## 2021-02-17 NOTE — Op Note (Signed)
[facility] Patient Name: Caitlin Roberts Procedure Date: 02/17/2021 1:37 PM MRN: [id number] Account #: [id number] Date of Birth: 16-Nov-1995 Admit Type: Outpatient Age: 25 Room: [facility] ENDO ROOM 1 Gender: Female Note Status: Finalized Instrument Name: Jasper Riling [id number] Procedure:             Colonoscopy Indications:           Screening in patient at increased risk: Colorectal                         cancer in father before age 54 Providers:             Andrey Farmer MD, MD Referring MD:          Delsa Grana (Referring MD) Medicines:             Monitored Anesthesia Care Complications:         No immediate complications. Estimated blood loss:                         Minimal. Procedure:             Pre-Anesthesia Assessment:                        - Prior to the procedure, a History and Physical was                         performed, and patient medications and allergies were                         reviewed. The patient is competent. The risks and                         benefits of the procedure and the sedation options and                         risks were discussed with the patient. All questions                         were answered and informed consent was obtained.                         Patient identification and proposed procedure were                         verified by the physician, the nurse, the anesthetist                         and the technician in the endoscopy suite. Mental                         Status Examination: alert and oriented. Airway                         Examination: normal oropharyngeal airway and neck                         mobility. Respiratory Examination: clear to  auscultation. CV Examination: normal. Prophylactic                         Antibiotics: The patient does not require prophylactic                         antibiotics. Prior Anticoagulants: The patient has                          taken no previous anticoagulant or antiplatelet                         agents. ASA Grade Assessment: I - A normal, healthy                         patient. After reviewing the risks and benefits, the                         patient was deemed in satisfactory condition to                         undergo the procedure. The anesthesia plan was to use                         monitored anesthesia care (MAC). Immediately prior to                         administration of medications, the patient was                         re-assessed for adequacy to receive sedatives. The                         heart rate, respiratory rate, oxygen saturations,                         blood pressure, adequacy of pulmonary ventilation, and                         response to care were monitored throughout the                         procedure. The physical status of the patient was                         re-assessed after the procedure.                        After obtaining informed consent, the colonoscope was                         passed under direct vision. Throughout the procedure,                         the patient's blood pressure, pulse, and oxygen                         saturations were monitored continuously. The  Colonoscope was introduced through the anus and                         advanced to the the terminal ileum. The colonoscopy                         was performed without difficulty. The patient                         tolerated the procedure well. The quality of the bowel                         preparation was good. Findings:      The perianal and digital rectal examinations were normal.      The terminal ileum appeared normal.      A 4 mm polyp was found in the sigmoid colon. The polyp was       semi-pedunculated. The polyp was removed with a cold snare. Resection       and retrieval were complete. Estimated blood loss was minimal.      The exam was  otherwise without abnormality on direct and retroflexion       views. Impression:            - The examined portion of the ileum was normal.                        - One 4 mm polyp in the sigmoid colon, removed with a                         cold snare. Resected and retrieved.                        - The examination was otherwise normal on direct and                         retroflexion views. Recommendation:        - Discharge patient to home.                        - Resume previous diet.                        - Continue present medications.                        - Await pathology results.                        - Repeat colonoscopy in 5 years for surveillance.                        - Return to referring physician as previously                         scheduled. Procedure Code(s):     --- Professional ---                        [phone], Colonoscopy, flexible; with removal of  tumor(s), polyp(s), or other lesion(s) by snare                         technique Diagnosis Code(s):     --- Professional ---                        Z80.0, Family history of malignant neoplasm of                         digestive organs                        K63.5, Polyp of colon CPT copyright 2019 American Medical Association. All rights reserved. The codes documented in this report are preliminary and upon coder review may  be revised to meet current compliance requirements. Andrey Farmer MD, MD 02/17/2021 2:04:10 PM Number of Addenda: 0 Note Initiated On: 02/17/2021 1:37 PM Scope Withdrawal Time: 0 hours 9 minutes 32 seconds  Total Procedure Duration: 0 hours 15 minutes 57 seconds  Estimated Blood Loss:  Estimated blood loss was minimal.      [facility]

## 2021-02-17 NOTE — Anesthesia Preprocedure Evaluation (Signed)
Anesthesia Evaluation  Patient identified by MRN, date of birth, ID band Patient awake    Reviewed: Allergy & Precautions, NPO status , Patient's Chart, lab work & pertinent test results  History of Anesthesia Complications Negative for: history of anesthetic complications  Airway Mallampati: I   Neck ROM: Full    Dental no notable dental hx.    Pulmonary neg pulmonary ROS,    Pulmonary exam normal breath sounds clear to auscultation       Cardiovascular Exercise Tolerance: Good negative cardio ROS Normal cardiovascular exam Rhythm:Regular Rate:Normal     Neuro/Psych negative neurological ROS     GI/Hepatic GERD  ,  Endo/Other  Obesity   Renal/GU negative Renal ROS     Musculoskeletal   Abdominal   Peds  Hematology negative hematology ROS (+)   Anesthesia Other Findings   Reproductive/Obstetrics                             Anesthesia Physical Anesthesia Plan  ASA: 2  Anesthesia Plan: General   Post-op Pain Management:    Induction: Intravenous  PONV Risk Score and Plan: 3 and Propofol infusion, TIVA and Treatment may vary due to age or medical condition  Airway Management Planned: Natural Airway  Additional Equipment:   Intra-op Plan:   Post-operative Plan:   Informed Consent: I have reviewed the patients History and Physical, chart, labs and discussed the procedure including the risks, benefits and alternatives for the proposed anesthesia with the patient or authorized representative who has indicated his/her understanding and acceptance.       Plan Discussed with: CRNA  Anesthesia Plan Comments: (Patient is a Jehovah's witness and refuses blood product administration in any instance.)        Anesthesia Quick Evaluation

## 2021-02-17 NOTE — Interval H&P Note (Signed)
History and Physical Interval Note:  02/17/2021 1:36 PM  Lorin Picket  has presented today for surgery, with the diagnosis of Screening.  The various methods of treatment have been discussed with the patient and family. After consideration of risks, benefits and other options for treatment, the patient has consented to  Procedure(s): COLONOSCOPY WITH PROPOFOL (N/A) as a surgical intervention.  The patient's history has been reviewed, patient examined, no change in status, stable for surgery.  I have reviewed the patient's chart and labs.  Questions were answered to the patient's satisfaction.     Lesly Rubenstein  Ok to proceed with colonoscopy

## 2021-02-17 NOTE — Transfer of Care (Signed)
Immediate Anesthesia Transfer of Care Note  Patient: Caitlin Roberts  Procedure(s) Performed: COLONOSCOPY WITH PROPOFOL  Patient Location: PACU and Endoscopy Unit  Anesthesia Type:General  Level of Consciousness: drowsy and patient cooperative  Airway & Oxygen Therapy: Patient Spontanous Breathing  Post-op Assessment: Report given to RN and Post -op Vital signs reviewed and stable  Post vital signs: Reviewed and stable  Last Vitals:  Vitals Value Taken Time  BP 86/47 02/17/21 1406  Temp 36.5 C 02/17/21 1405  Pulse 68 02/17/21 1407  Resp 21 02/17/21 1407  SpO2 96 % 02/17/21 1407  Vitals shown include unvalidated device data.  Last Pain:  Vitals:   02/17/21 1405  TempSrc: Temporal  PainSc: Asleep         Complications: No notable events documented.

## 2021-02-17 NOTE — H&P (Signed)
Outpatient short stay form Pre-procedure 02/17/2021  Lesly Rubenstein, MD  Primary Physician: Delsa Grana, PA-C  Reason for visit:  Family history of colon cancer  History of present illness:   25 y/o lady who father had colon cancer at the age of 43 here for screening colonoscopy. No blood thinners. No abdominal surgeries. No significant symptoms.    Current Facility-Administered Medications:    0.9 %  sodium chloride infusion, , Intravenous, Continuous, Kanai Berrios, Hilton Cork, MD, Last Rate: 20 mL/hr at 02/17/21 1313, Continued from Pre-op at 02/17/21 1313  Facility-Administered Medications Prior to Admission  Medication Dose Route Frequency Provider Last Rate Last Admin   etonogestrel (NEXPLANON) implant 68 mg  68 mg Subdermal Once Schuman, Christanna R, MD       Medications Prior to Admission  Medication Sig Dispense Refill Last Dose   cetirizine (ZYRTEC) 10 MG tablet Take 10 mg by mouth daily.   Past Month   hyoscyamine (LEVSIN SL) 0.125 MG SL tablet Place 0.125 mg under the tongue every 6 (six) hours as needed. (Patient not taking: Reported on 02/17/2021)   Not Taking     Allergies  Allergen Reactions   Pork-Derived Products      Past Medical History:  Diagnosis Date   Allergy    History of sinusitis     Review of systems:  Otherwise negative.    Physical Exam  Gen: Alert, oriented. Appears stated age.  HEENT: PERRLA. Lungs: No respiratory distress CV: RRR Abd: soft, benign, no masses Ext: No edema    Planned procedures: Proceed with colonoscopy. The patient understands the nature of the planned procedure, indications, risks, alternatives and potential complications including but not limited to bleeding, infection, perforation, damage to internal organs and possible oversedation/side effects from anesthesia. The patient agrees and gives consent to proceed.  Please refer to procedure notes for findings, recommendations and patient disposition/instructions.      Lesly Rubenstein, MD [facility]

## 2021-02-18 ENCOUNTER — Encounter: Payer: Self-pay | Admitting: Gastroenterology

## 2021-02-18 LAB — SURGICAL PATHOLOGY

## 2021-02-18 NOTE — Anesthesia Postprocedure Evaluation (Signed)
Anesthesia Post Note  Patient: Caitlin Roberts  Procedure(s) Performed: COLONOSCOPY WITH PROPOFOL  Patient location during evaluation: PACU Anesthesia Type: General Level of consciousness: awake and alert, oriented and patient cooperative Pain management: pain level controlled Vital Signs Assessment: post-procedure vital signs reviewed and stable Respiratory status: spontaneous breathing, nonlabored ventilation and respiratory function stable Cardiovascular status: blood pressure returned to baseline and stable Postop Assessment: adequate PO intake Anesthetic complications: no   No notable events documented.   Last Vitals:  Vitals:   02/17/21 1248 02/17/21 1405  BP: 102/80 (!) 86/47  Pulse: 81 70  Resp: 16 (!) 21  Temp: (!) 36.2 C 36.5 C  SpO2: 100% 97%    Last Pain:  Vitals:   02/18/21 0728  TempSrc:   PainSc: 0-No pain                 Darrin Nipper

## 2021-03-23 ENCOUNTER — Encounter: Payer: Self-pay | Admitting: Family Medicine

## 2021-03-25 LAB — HM PAP SMEAR: HM Pap smear: NEGATIVE

## 2021-05-25 ENCOUNTER — Emergency Department
Admission: EM | Admit: 2021-05-25 | Discharge: 2021-05-25 | Disposition: A | Discharge status: Home / Self Care | Payer: 59 | Attending: Emergency Medicine | Admitting: Emergency Medicine

## 2021-05-25 ENCOUNTER — Other Ambulatory Visit: Payer: Self-pay

## 2021-05-25 ENCOUNTER — Encounter: Payer: Self-pay | Admitting: Emergency Medicine

## 2021-05-25 ENCOUNTER — Emergency Department: Payer: 59

## 2021-05-25 ENCOUNTER — Other Ambulatory Visit
Admission: RE | Admit: 2021-05-25 | Discharge: 2021-05-25 | Disposition: A | Discharge status: Home / Self Care | Payer: 59 | Source: Ambulatory Visit | Attending: Internal Medicine | Admitting: Internal Medicine

## 2021-05-25 DIAGNOSIS — R0789 Other chest pain: Secondary | ICD-10-CM | POA: Diagnosis present

## 2021-05-25 DIAGNOSIS — B9689 Other specified bacterial agents as the cause of diseases classified elsewhere: Secondary | ICD-10-CM | POA: Insufficient documentation

## 2021-05-25 DIAGNOSIS — Z20822 Contact with and (suspected) exposure to covid-19: Secondary | ICD-10-CM | POA: Insufficient documentation

## 2021-05-25 DIAGNOSIS — R079 Chest pain, unspecified: Secondary | ICD-10-CM | POA: Insufficient documentation

## 2021-05-25 DIAGNOSIS — R918 Other nonspecific abnormal finding of lung field: Secondary | ICD-10-CM | POA: Diagnosis not present

## 2021-05-25 DIAGNOSIS — J019 Acute sinusitis, unspecified: Secondary | ICD-10-CM | POA: Insufficient documentation

## 2021-05-25 DIAGNOSIS — R0602 Shortness of breath: Secondary | ICD-10-CM | POA: Insufficient documentation

## 2021-05-25 DIAGNOSIS — R000 Tachycardia, unspecified: Secondary | ICD-10-CM | POA: Insufficient documentation

## 2021-05-25 LAB — POC URINE PREG, ED: Preg Test, Ur: NEGATIVE

## 2021-05-25 LAB — D-DIMER, QUANTITATIVE: D-Dimer, Quant: 0.87 ug/mL-FEU — ABNORMAL HIGH (ref 0.00–0.50)

## 2021-05-25 MED ORDER — IOHEXOL 350 MG/ML SOLN
75.0000 mL | Freq: Once | INTRAVENOUS | Status: AC | PRN
  Administered 2021-05-25: 75 mL via INTRAVENOUS

## 2021-05-25 MED ORDER — PREDNISONE 20 MG PO TABS
60.0000 mg | ORAL_TABLET | Freq: Once | ORAL | Status: AC
  Administered 2021-05-25: 60 mg via ORAL
  Filled 2021-05-25: qty 3

## 2021-05-25 NOTE — ED Provider Notes (Signed)
[facility] Provider Note    Event Date/Time   First MD Initiated Contact with Patient 05/25/21 2104     (approximate)   History   Chest Pain   HPI  Caitlin Roberts is a 26 y.o. female with no significant past medical history and as listed in EMR presents to the emergency department for treatment and evaluation of pain in the left chest that radiates into her left shoulder. Symptoms started 2 days ago. Labs including d-dimer drawn. Dimer elevated and she was sent to the ER for further testing.  COVID, influenza, and RSV all negative in clinic today.      Physical Exam   Triage Vital Signs: ED Triage Vitals  Enc Vitals Group     BP 05/25/21 1850 114/87     Pulse Rate 05/25/21 1850 (!) 113     Resp 05/25/21 1850 20     Temp 05/25/21 1850 98.7 F (37.1 C)     Temp Source 05/25/21 1850 Oral     SpO2 05/25/21 1850 99 %     Weight 05/25/21 1848 173 lb (78.5 kg)     Height 05/25/21 1848 5\' 3"  (1.6 m)     Head Circumference --      Peak Flow --      Pain Score 05/25/21 1848 8     Pain Loc --      Pain Edu? --      Excl. in [place]? --     Most recent vital signs: Vitals:   05/25/21 1850 05/25/21 2146  BP: 114/87 117/83  Pulse: (!) 113 93  Resp: 20 20  Temp: 98.7 F (37.1 C)   SpO2: 99% 99%    General: Awake, no distress.  CV:  Good peripheral perfusion.  Resp:  Normal effort.  Abd:  No distention.  Other:     ED Results / Procedures / Treatments   Labs (all labs ordered are listed, but only abnormal results are displayed) Labs Reviewed  POC URINE PREG, ED     EKG    RADIOLOGY  Image and radiology report reviewed by me.  CTA chest for PE shows scattered nodular groundglass airspace disease within the left upper lobe which is likely inflammatory or infectious.  No PE identified.  PROCEDURES:  Critical Care performed: No  Procedures   MEDICATIONS ORDERED IN ED: Medications  iohexol (OMNIPAQUE) 350 MG/ML injection 75 mL  (75 mLs Intravenous Contrast Given 05/25/21 1957)  predniSONE (DELTASONE) tablet 60 mg (60 mg Oral Given 05/25/21 2137)     IMPRESSION / MDM / ASSESSMENT AND PLAN / ED COURSE  I reviewed the triage vital signs and the nursing notes.  Differential diagnosis includes, but is not limited to, PE, COVID, pneumonia, inflammatory response  26 year old female presenting to the emergency department for CTA of the chest to rule out PE.  See HPI for further details.  Chest CT shows no PE, however there is nodular groundglass airspace disease within the left upper lobe.  This is in the area of her pain.  She tested negative for COVID today.  She states she had a last year but has still felt somewhat short of breath with exertion.  She was given prescriptions for azithromycin, cough meds, and prednisone earlier today when she was evaluated at the clinic for presumed bronchitis.  She will be advised to continue these medications as prescribed and until finished.  Vital signs are reassuring.  She is not tachypneic, tachycardic, febrile, and  oxygen saturation is 99% on room air.  She will be advised to follow-up with her primary care provider for a repeat chest x-ray in about 4 weeks just to make sure there is no residual airspace disease.  If she becomes increasingly short of breath and is unable to see primary care she is to return to the emergency department.      FINAL CLINICAL IMPRESSION(S) / ED DIAGNOSES   Final diagnoses:  Ground glass opacity present on imaging of lung     Rx / DC Orders   ED Discharge Orders     None        Note:  This document was prepared using Dragon voice recognition software and may include unintentional dictation errors.    Victorino Dike, FNP 05/25/21 6067    Blake Divine, MD 05/25/21 2352

## 2021-05-25 NOTE — ED Triage Notes (Signed)
Pt via POV from home. Pt c/o L sided CP that radiates to her L shoulder since Saturday night. Got blood work done and was told her D-Dimer was elevated. Denies using birth control at this time. Also, endorses SOB and cough.  Pt is A&Ox4 and NAD

## 2021-05-25 NOTE — Discharge Instructions (Signed)
Please follow up with your primary care provider for a chest x-ray in about 3-4 weeks or sooner for symptoms that are not improving.  Return to the ER for symptoms that change, worsen, or for new concerns.

## 2021-05-25 NOTE — ED Provider Triage Note (Signed)
Emergency Medicine Provider Triage Evaluation Note  Caitlin Roberts , a 26 y.o. female  was evaluated in triage.  Pt complains of left side chest pain x 2 days. Evaluated at [facility] today. D-dimer elevated. Sent for CT. No history of DVT/PE, non-smoker, stopped birth control about a month ago.  Review of Systems  Positive: Shortness of breath, chest pain Negative: Fever  Physical Exam  There were no vitals taken for this visit. Gen:   Awake, no distress   Resp:  Normal effort  MSK:   Moves extremities without difficulty  Other:    Medical Decision Making  Medically screening exam initiated at 6:47 PM.  Appropriate orders placed.  FREYA ZOBRIST was informed that the remainder of the evaluation will be completed by another provider, this initial triage assessment does not replace that evaluation, and the importance of remaining in the ED until their evaluation is complete.   Victorino Dike, FNP 05/25/21 1850

## 2021-05-25 NOTE — ED Notes (Signed)
E signature pad not working. Pt educated on discharge instructions and verbalized understanding.  

## 2021-06-01 ENCOUNTER — Telehealth: Payer: Self-pay

## 2021-06-01 NOTE — Telephone Encounter (Signed)
Copied from [place] [id number]. Topic: Appointment Scheduling - Scheduling Inquiry for Clinic >> Jun 01, 2021  9:39 AM Rayann Heman wrote: Reason for CRM: Pt called and stated that she spoke with someone at the office regarding CT results. She was told that she needed a chest xray. She was told that someone would call regarding chest xray but did not receive call. Pt would like a call back from the office. Please advise

## 2021-06-08 ENCOUNTER — Ambulatory Visit
Admission: RE | Admit: 2021-06-08 | Discharge: 2021-06-08 | Disposition: A | Discharge status: Home / Self Care | Payer: 59 | Attending: Physician Assistant | Admitting: Physician Assistant

## 2021-06-08 ENCOUNTER — Encounter: Payer: Self-pay | Admitting: Physician Assistant

## 2021-06-08 ENCOUNTER — Other Ambulatory Visit: Payer: Self-pay

## 2021-06-08 ENCOUNTER — Ambulatory Visit (INDEPENDENT_AMBULATORY_CARE_PROVIDER_SITE_OTHER): Payer: 59 | Admitting: Physician Assistant

## 2021-06-08 ENCOUNTER — Ambulatory Visit
Admission: RE | Admit: 2021-06-08 | Discharge: 2021-06-08 | Disposition: A | Discharge status: Home / Self Care | Payer: 59 | Source: Ambulatory Visit | Attending: Physician Assistant | Admitting: Physician Assistant

## 2021-06-08 VITALS — BP 104/62 | HR 118 | Temp 98.0°F | Resp 16 | Ht 64.0 in | Wt 175.4 lb

## 2021-06-08 DIAGNOSIS — J392 Other diseases of pharynx: Secondary | ICD-10-CM

## 2021-06-08 DIAGNOSIS — R918 Other nonspecific abnormal finding of lung field: Secondary | ICD-10-CM

## 2021-06-08 DIAGNOSIS — Z0000 Encounter for general adult medical examination without abnormal findings: Secondary | ICD-10-CM

## 2021-06-08 DIAGNOSIS — R5383 Other fatigue: Secondary | ICD-10-CM

## 2021-06-08 DIAGNOSIS — R0602 Shortness of breath: Secondary | ICD-10-CM

## 2021-06-08 MED ORDER — ALBUTEROL SULFATE HFA 108 (90 BASE) MCG/ACT IN AERS: 2.0000 | INHALATION_SPRAY | Freq: Four times a day (QID) | RESPIRATORY_TRACT | 0 refills | Status: DC | PRN

## 2021-06-08 NOTE — Patient Instructions (Addendum)
I have made a referral to ENT for you so we can follow up with the mass I saw in your throat  The referral team should call you to help coordinate this apt  I have sent in lab orders so you can get those completed at your convenience  It was nice to meet you and I appreciate the opportunity to be involved in your care

## 2021-06-08 NOTE — Progress Notes (Signed)
Established Patient Office Visit  Subjective:  Patient ID: Caitlin Roberts, female    DOB: [birth year]  Age: 26 y.o. MRN: [id number]  CC:  Chief Complaint  Patient presents with   Hospitalization Follow-up    Pt would like x-ray done at the hospital [facility].    HPI Caitlin Roberts presents for left sided chest pain- PE was ruled out in ED with CTA  ED recommended follow up to rule out residual air space disease She states she will get mildly short of breath if she has to walk longer than 10 minutes States she has a "wet" cough with globus sensation at bottom of neck States she has intermittent 4/10 left sided chest pain that causes her to have to take shallow breaths States she had asthma as a kid but this shortness of breath feels different   Past Medical History:  Diagnosis Date   Allergy    History of sinusitis     Past Surgical History:  Procedure Laterality Date   BUNIONECTOMY Left    about 35yrs ago   BUNIONECTOMY Left    COLONOSCOPY WITH PROPOFOL N/A 02/17/2021   Procedure: COLONOSCOPY WITH PROPOFOL;  Surgeon: Lesly Rubenstein, MD;  [facility] END[facility]COPY;  Service: Endoscopy;  Laterality: N/A;    Family History  Problem Relation Age of Onset   Cancer Father        colon   Kidney Stones Father     Social History   Socioeconomic History   Marital status: Married    Spouse name: Rosanne Gutting   Number of children: 0   Years of education: Not on file   Highest education level: Bachelor's degree (e.g., BA, AB, BS)  Occupational History   Occupation: Engineer, materials  Tobacco Use   Smoking status: Never   Smokeless tobacco: Never  Vaping Use   Vaping Use: Never used  Substance and Sexual Activity   Alcohol use: Yes    Alcohol/week: 0.0 standard drinks    Comment: occasionally   Drug use: No   Sexual activity: Yes    Partners: Male    Birth control/protection: Implant, Condom    Comment: Nexplanon   Other Topics Concern   Not on file  Social History  Narrative   Patient recently got married in 01/2018. Name will be changing to [place].   Social Determinants of Health   Financial Resource Strain: Not on file  Food Insecurity: Not on file  Transportation Needs: Not on file  Physical Activity: Not on file  Stress: Not on file  Social Connections: Not on file  Intimate Partner Violence: Not on file    Outpatient Medications Prior to Visit  Medication Sig Dispense Refill   cetirizine (ZYRTEC) 10 MG tablet Take 10 mg by mouth daily.     hyoscyamine (LEVSIN SL) 0.125 MG SL tablet Place 0.125 mg under the tongue every 6 (six) hours as needed. (Patient not taking: Reported on 02/17/2021)     Facility-Administered Medications Prior to Visit  Medication Dose Route Frequency Provider Last Rate Last Admin   etonogestrel (NEXPLANON) implant 68 mg  68 mg Subdermal Once Schuman, Christanna R, MD        Allergies  Allergen Reactions   Pork-Derived Products     ROS Review of Systems  Constitutional:  Negative for fatigue and fever.  Respiratory:  Positive for cough, shortness of breath and wheezing. Negative for chest tightness.   Cardiovascular:  Positive for chest pain (intermittent). Negative for palpitations and leg  swelling.  Gastrointestinal:  Negative for constipation, diarrhea, nausea and vomiting.  Musculoskeletal:  Negative for back pain.     Objective:    Physical Exam Vitals reviewed.  Constitutional:      Appearance: Normal appearance. She is obese.  HENT:     Head: Normocephalic and atraumatic.     Mouth/Throat:     Mouth: Mucous membranes are moist.     Tongue: Tongue does not deviate from midline.     Pharynx: Uvula midline. No pharyngeal swelling, oropharyngeal exudate, posterior oropharyngeal erythema or uvula swelling.     Tonsils: No tonsillar exudate or tonsillar abscesses.   Cardiovascular:     Rate and Rhythm: Normal rate and regular rhythm.     Pulses: Normal pulses.     Heart sounds: Normal heart  sounds.  Pulmonary:     Effort: Pulmonary effort is normal.     Breath sounds: Normal breath sounds. No decreased breath sounds, wheezing, rhonchi or rales.  Musculoskeletal:     Right lower leg: No edema.     Left lower leg: No edema.  Neurological:     Mental Status: She is alert.    BP 104/62    Pulse (!) 118    Temp 98 F (36.7 C) (Oral)    Resp 16    Ht 5\' 4"  (1.626 m)    Wt 175 lb 6.4 oz (79.6 kg)    SpO2 98%    BMI 30.11 kg/m  Wt Readings from Last 3 Encounters:  06/08/21 175 lb 6.4 oz (79.6 kg)  05/25/21 173 lb (78.5 kg)  02/17/21 175 lb (79.4 kg)     Health Maintenance Due  Topic Date Due   COVID-19 Vaccine (4 - Booster for Pfizer series) 08/29/2020   PAP-Cervical Cytology Screening  12/16/2020   PAP SMEAR-Modifier  12/16/2020    There are no preventive care reminders to display for this patient.  Lab Results  Component Value Date   TSH 1.29 11/02/2017   Lab Results  Component Value Date   WBC 6.7 08/08/2019   HGB 14.5 08/08/2019   HCT 42.7 08/08/2019   MCV 89.7 08/08/2019   PLT 399 08/08/2019   Lab Results  Component Value Date   NA 138 08/08/2019   K 4.0 08/08/2019   CO2 28 08/08/2019   GLUCOSE 89 08/08/2019   BUN 11 08/08/2019   CREATININE 0.61 08/08/2019   BILITOT 0.5 08/08/2019   ALKPHOS 61 01/20/2016   AST 18 08/08/2019   ALT 19 08/08/2019   PROT 6.8 08/08/2019   ALBUMIN 4.5 01/20/2016   CALCIUM 9.1 08/08/2019   Lab Results  Component Value Date   CHOL 225 (H) 08/08/2019   Lab Results  Component Value Date   HDL 45 (L) 08/08/2019   Lab Results  Component Value Date   LDLCALC 158 (H) 08/08/2019   Lab Results  Component Value Date   TRIG 108 08/08/2019   Lab Results  Component Value Date   CHOLHDL 5.0 (H) 08/08/2019   No results found for: HGBA1C    Assessment & Plan:   Problem List Items Addressed This Visit       Other   Preventative health care   Relevant Orders   CBC w/Diff/Platelet   Comprehensive Metabolic  Panel (CMET)   Lipid Profile   Vitamin D (25 hydroxy)   Vitamin B12   Mass of oropharynx - Primary    Acute, new problem, appears stable Incidental finding during PE  Mass appeared to  be roughly 3cm wide, white to yellowish in appearance Did not fully visualize due to location but not suspicious for exudative or infectious mass at this time especially with reassuring vitals and rest of PE Unsure if this is just physiologic deviation vs mass Referral to ENT for further evaluation  Follow up in 3 months to discuss      Relevant Orders   Ambulatory referral to ENT   Other Visit Diagnoses     Ground glass opacity present on imaging of lung     Acute, new problem discovered at ED during CTA to rule out PE Will order DG chest for continued monitoring Provided Albuterol inhaler for continued SOB  May need to refer to Pulmonology for further work up  Discussed appropriate use of albuterol inhaler and that if she is having to use more than 2 times per day we may need to add controller.  Follow up in 3 months   Relevant Orders   DG Chest 2 View (Completed)   Shortness of breath       Relevant Medications   albuterol (VENTOLIN HFA) 108 (90 Base) MCG/ACT inhaler   Fatigue, unspecified type       Relevant Orders   Vitamin D (25 hydroxy)   Vitamin B12        Return in about 3 months (around 09/05/2021).   I, Rockne Dearinger E Rakayla Ricklefs, PA-C, have reviewed all documentation for this visit. The documentation on 06/08/21 for the exam, diagnosis, procedures, and orders are all accurate and complete.   Wanda Rideout, Glennie Isle MPH [facility]     Return in about 3 months (around 09/05/2021).   I, Desi Rowe E Hasson Gaspard, PA-C, have reviewed all documentation for this visit. The documentation on 06/08/21 for the exam, diagnosis, procedures, and orders are all accurate and complete.   Alfred Eckley, Glennie Isle MPH [facility]   No  orders of the defined types were placed in this encounter.   Follow-up: No follow-ups on file.    Jamy Whyte E Nalina Yeatman, PA-C

## 2021-06-08 NOTE — Assessment & Plan Note (Addendum)
Acute, new problem, appears stable Incidental finding during PE  Mass appeared to be roughly 3cm wide, white to yellowish in appearance Did not fully visualize due to location but not suspicious for exudative or infectious mass at this time especially with reassuring vitals and rest of PE Unsure if this is just physiologic deviation vs mass Referral to ENT for further evaluation  Follow up in 3 months to discuss

## 2021-06-09 LAB — LIPID PANEL

## 2021-06-10 ENCOUNTER — Encounter: Payer: Self-pay | Admitting: Physician Assistant

## 2021-06-10 LAB — CBC WITH DIFFERENTIAL/PLATELET
Basophils Absolute: 0.1 10*3/uL (ref 0.0–0.2)
Basos: 1 %
EOS (ABSOLUTE): 0.2 10*3/uL (ref 0.0–0.4)
Eos: 2 %
Hematocrit: 43.6 % (ref 34.0–46.6)
Hemoglobin: 14.6 g/dL (ref 11.1–15.9)
Immature Grans (Abs): 0.1 10*3/uL (ref 0.0–0.1)
Immature Granulocytes: 1 %
Lymphocytes Absolute: 2.7 10*3/uL (ref 0.7–3.1)
Lymphs: 34 %
MCH: 30.1 pg (ref 26.6–33.0)
MCHC: 33.5 g/dL (ref 31.5–35.7)
MCV: 90 fL (ref 79–97)
Monocytes Absolute: 0.8 10*3/uL (ref 0.1–0.9)
Monocytes: 10 %
Neutrophils Absolute: 4.3 10*3/uL (ref 1.4–7.0)
Neutrophils: 52 %
Platelets: 371 10*3/uL (ref 150–450)
RBC: 4.85 x10E6/uL (ref 3.77–5.28)
RDW: 12.9 % (ref 11.7–15.4)
WBC: 8.1 10*3/uL (ref 3.4–10.8)

## 2021-06-10 LAB — COMPREHENSIVE METABOLIC PANEL
ALT: 65 IU/L — ABNORMAL HIGH (ref 0–32)
AST: 38 IU/L (ref 0–40)
Albumin/Globulin Ratio: 1.8 (ref 1.2–2.2)
Albumin: 4.4 g/dL (ref 3.9–5.0)
Alkaline Phosphatase: 93 IU/L (ref 44–121)
BUN/Creatinine Ratio: 16 (ref 9–23)
BUN: 11 mg/dL (ref 6–20)
Bilirubin Total: 0.4 mg/dL (ref 0.0–1.2)
CO2: 19 mmol/L — ABNORMAL LOW (ref 20–29)
Calcium: 9.2 mg/dL (ref 8.7–10.2)
Chloride: 105 mmol/L (ref 96–106)
Creatinine, Ser: 0.67 mg/dL (ref 0.57–1.00)
Globulin, Total: 2.4 g/dL (ref 1.5–4.5)
Glucose: 84 mg/dL (ref 70–99)
Potassium: 4.2 mmol/L (ref 3.5–5.2)
Sodium: 143 mmol/L (ref 134–144)
Total Protein: 6.8 g/dL (ref 6.0–8.5)
eGFR: 123 mL/min/{1.73_m2} (ref >59)

## 2021-06-10 LAB — LIPID PANEL
Chol/HDL Ratio: 5 ratio — ABNORMAL HIGH (ref 0.0–4.4)
Cholesterol, Total: 240 mg/dL — ABNORMAL HIGH (ref 100–199)
HDL: 48 mg/dL (ref >39)
LDL Chol Calc (NIH): 151 mg/dL — ABNORMAL HIGH (ref 0–99)
Triglycerides: 225 mg/dL — ABNORMAL HIGH (ref 0–149)
VLDL Cholesterol Cal: 41 mg/dL — ABNORMAL HIGH (ref 5–40)

## 2021-06-10 LAB — VITAMIN B12: Vitamin B-12: 384 pg/mL (ref 232–1245)

## 2021-06-10 LAB — VITAMIN D 25 HYDROXY (VIT D DEFICIENCY, FRACTURES): Vit D, 25-Hydroxy: 10.6 ng/mL — ABNORMAL LOW (ref 30.0–100.0)

## 2021-06-10 NOTE — Telephone Encounter (Signed)
This encounter was created in error - please disregard.

## 2021-07-09 ENCOUNTER — Other Ambulatory Visit: Payer: Self-pay | Admitting: Physician Assistant

## 2021-07-09 NOTE — Telephone Encounter (Signed)
Requested Prescriptions  ?Pending Prescriptions Disp Refills  ?? albuterol (VENTOLIN HFA) 108 (90 Base) MCG/ACT inhaler [Pharmacy Med Name: ALBUTEROL HFA (VENTOLIN) INH] 18 each 2  ?  Sig: TAKE 2 PUFFS BY MOUTH EVERY 6 HOURS AS NEEDED FOR WHEEZE OR SHORTNESS OF BREATH  ?  ? Pulmonology:  Beta Agonists 2 Failed - 07/09/2021  1:54 AM  ?  ?  Failed - Last Heart Rate in normal range  ?  Pulse Readings from Last 1 Encounters:  ?06/08/21 (!) 118  ?   ?  ?  Passed - Last BP in normal range  ?  BP Readings from Last 1 Encounters:  ?06/08/21 104/62  ?   ?  ?  Passed - Valid encounter within last 12 months  ?  Recent Outpatient Visits   ?      ? 1 month ago Ground glass opacity present on imaging of lung  ? [place], PA-C  ? 9 months ago Irritable bowel syndrome with diarrhea  ? [facility] Kathrine Haddock, NP  ? 1 year ago Adult general medical exam  ? [facility] Delsa Grana, PA-C  ? 3 years ago Acute non-recurrent frontal sinusitis  ? [place], NP  ? 3 years ago Preventative health care  ? [facility] Lada, Satira Anis, MD  ?  ?  ?Future Appointments   ?        ? In 2 months Teodora Medici, [facility], [place]  ?  ? ?  ?  ?  ? ? ?

## 2021-09-01 ENCOUNTER — Encounter: Payer: Self-pay | Admitting: Family Medicine

## 2021-09-01 ENCOUNTER — Ambulatory Visit (INDEPENDENT_AMBULATORY_CARE_PROVIDER_SITE_OTHER): Payer: 59 | Admitting: Family Medicine

## 2021-09-01 VITALS — BP 116/72 | HR 70 | Temp 98.2°F | Resp 14 | Ht 63.5 in | Wt 170.3 lb

## 2021-09-01 DIAGNOSIS — E782 Mixed hyperlipidemia: Secondary | ICD-10-CM

## 2021-09-01 DIAGNOSIS — E559 Vitamin D deficiency, unspecified: Secondary | ICD-10-CM | POA: Diagnosis not present

## 2021-09-01 DIAGNOSIS — R7989 Other specified abnormal findings of blood chemistry: Secondary | ICD-10-CM | POA: Diagnosis not present

## 2021-09-01 DIAGNOSIS — R5383 Other fatigue: Secondary | ICD-10-CM

## 2021-09-01 NOTE — Progress Notes (Deleted)
Name: Caitlin Roberts   MRN: [id number]    DOB: 03/01/1996   Date:09/01/2021 ? ?     Progress Note ? ?Subjective ? ?Chief Complaint ? ?Chief Complaint  ?Patient presents with  ? Follow-up  ? ? ?HPI ? ? ?Patient Active Problem List  ? Diagnosis Date Noted  ? Mass of oropharynx 06/08/2021  ? Seasonal allergies 06/21/2018  ? Preventative health care 11/02/2017  ? Cervical cancer screening 01/20/2016  ? Family hx of colon cancer 01/20/2016  ? Encounter for screening for HIV 01/20/2016  ? Annual physical exam 12/24/2014  ? Lesion of lower eyelid 12/24/2014  ? Bunion of great toe of left foot 12/24/2014  ? Bunion of great toe of right foot 12/24/2014  ? Allergic headache 12/24/2014  ? Influenza vaccination declined by patient 12/24/2014  ? Bumps on skin 12/24/2014  ? ? ?Past Surgical History:  ?Procedure Laterality Date  ? BUNIONECTOMY Left   ? about 40yr ago  ? BUNIONECTOMY Left   ? COLONOSCOPY WITH PROPOFOL N/A 02/17/2021  ? Procedure: COLONOSCOPY WITH PROPOFOL;  Surgeon: LLesly Rubenstein MD;  [facility];  Service: Endoscopy;  Laterality: N/A;  ? ? ?Family History  ?Problem Relation Age of Onset  ? Cancer Father   ?     colon  ? Kidney Stones Father   ? ? ?Social History  ? ?Tobacco Use  ? Smoking status: Never  ? Smokeless tobacco: Never  ?Substance Use Topics  ? Alcohol use: Yes  ?  Alcohol/week: 0.0 standard drinks  ?  Comment: occasionally  ? ? ? ?Current Outpatient Medications:  ?  albuterol (VENTOLIN HFA) 108 (90 Base) MCG/ACT inhaler, TAKE 2 PUFFS BY MOUTH EVERY 6 HOURS AS NEEDED FOR WHEEZE OR SHORTNESS OF BREATH, Disp: 18 each, Rfl: 2 ?  cetirizine (ZYRTEC) 10 MG tablet, Take 10 mg by mouth daily., Disp: , Rfl:  ? ?Current Facility-Administered Medications:  ?  etonogestrel (NEXPLANON) implant 68 mg, 68 mg, Subdermal, Once, Schuman, Christanna R, MD ? ?Allergies  ?Allergen Reactions  ? Pork-Derived Products   ? ? ?I personally reviewed {Reviewed:14835} with the patient/caregiver  today. ? ? ?ROS ? ?*** ? ?Objective ? ?There were no vitals filed for this visit. ? ?There is no height or weight on file to calculate BMI. ? ?Physical Exam ?*** ? ?Recent Results (from the past 2160 hour(s))  ?Vitamin D (25 hydroxy)     Status: Abnormal  ? Collection Time: 06/09/21  7:16 AM  ?Result Value Ref Range  ? Vit D, 25-Hydroxy 10.6 (L) 30.0 - 100.0 ng/mL  ?  Comment: Vitamin D deficiency has been defined by the Institute of ?Medicine and an Endocrine Society practice guideline as a ?level of serum 25-OH vitamin D less than 20 ng/mL (1,2). ?The Endocrine Society went on to further define vitamin D ?insufficiency as a level between 21 and 29 ng/mL (2). ?1. IOM (Institute of Medicine). 2010. Dietary reference ?   intakes for calcium and D. [place] [employer] ?   [employer] ?2. Holick MF, Binkley [place], Bischoff-Ferrari HA, et al. ?   Evaluation, treatment, and prevention of vitamin D ?   deficiency: an Endocrine Society clinical practice ?   guideline. JCEM. 2011 Jul; 96(7):1911-30. ?  ?Vitamin B12     Status: None  ? Collection Time: 06/09/21  7:16 AM  ?Result Value Ref Range  ? Vitamin B-12 384 232 - 1,245 pg/mL  ?CBC w/Diff/Platelet     Status: None  ? Collection  Time: 06/09/21  7:17 AM  ?Result Value Ref Range  ? WBC 8.1 3.4 - 10.8 x10E3/uL  ? RBC 4.85 3.77 - 5.28 x10E6/uL  ? Hemoglobin 14.6 11.1 - 15.9 g/dL  ? Hematocrit 43.6 34.0 - 46.6 %  ? MCV 90 79 - 97 fL  ? MCH 30.1 26.6 - 33.0 pg  ? MCHC 33.5 31.5 - 35.7 g/dL  ? RDW 12.9 11.7 - 15.4 %  ? Platelets 371 150 - 450 x10E3/uL  ? Neutrophils 52 Not Estab. %  ? Lymphs 34 Not Estab. %  ? Monocytes 10 Not Estab. %  ? Eos 2 Not Estab. %  ? Basos 1 Not Estab. %  ? Neutrophils Absolute 4.3 1.4 - 7.0 x10E3/uL  ? Lymphocytes Absolute 2.7 0.7 - 3.1 x10E3/uL  ? Monocytes Absolute 0.8 0.1 - 0.9 x10E3/uL  ? EOS (ABSOLUTE) 0.2 0.0 - 0.4 x10E3/uL  ? Basophils Absolute 0.1 0.0 - 0.2 x10E3/uL  ? Immature Granulocytes 1 Not Estab. %  ? Immature Grans (Abs)  0.1 0.0 - 0.1 x10E3/uL  ?Comprehensive Metabolic Panel (CMET)     Status: Abnormal  ? Collection Time: 06/09/21  7:17 AM  ?Result Value Ref Range  ? Glucose 84 70 - 99 mg/dL  ? BUN 11 6 - 20 mg/dL  ? Creatinine, Ser 0.67 0.57 - 1.00 mg/dL  ? eGFR 123 >59 mL/min/1.73  ? BUN/Creatinine Ratio 16 9 - 23  ? Sodium 143 134 - 144 mmol/L  ? Potassium 4.2 3.5 - 5.2 mmol/L  ? Chloride 105 96 - 106 mmol/L  ? CO2 19 (L) 20 - 29 mmol/L  ? Calcium 9.2 8.7 - 10.2 mg/dL  ? Total Protein 6.8 6.0 - 8.5 g/dL  ? Albumin 4.4 3.9 - 5.0 g/dL  ? Globulin, Total 2.4 1.5 - 4.5 g/dL  ? Albumin/Globulin Ratio 1.8 1.2 - 2.2  ? Bilirubin Total 0.4 0.0 - 1.2 mg/dL  ? Alkaline Phosphatase 93 44 - 121 IU/L  ? AST 38 0 - 40 IU/L  ? ALT 65 (H) 0 - 32 IU/L  ?Lipid Profile     Status: Abnormal  ? Collection Time: 06/09/21  7:17 AM  ?Result Value Ref Range  ? Cholesterol, Total 240 (H) 100 - 199 mg/dL  ? Triglycerides 225 (H) 0 - 149 mg/dL  ? HDL 48 >39 mg/dL  ? VLDL Cholesterol Cal 41 (H) 5 - 40 mg/dL  ? LDL Chol Calc (NIH) 151 (H) 0 - 99 mg/dL  ? Chol/HDL Ratio 5.0 (H) 0.0 - 4.4 ratio  ?  Comment:                                   T. Chol/HDL Ratio ?                                            Men  Women ?                              1/2 Avg.Risk  3.4    3.3 ?                                  Avg.Risk  5.0  4.4 ?                               2X Avg.Risk  9.6    7.1 ?                               3X Avg.Risk 23.4   11.0 ?  ? ? ?Diabetic Foot Exam: ?Diabetic Foot Exam - Simple   ?No data filed ?  ? ?*** ? ?PHQ2/9: ? ?  06/08/2021  ?  1:37 PM 09/18/2020  ?  3:02 PM 08/08/2019  ?  8:57 AM 06/21/2018  ? 11:53 AM 11/02/2017  ? 10:16 AM  ?Depression screen PHQ 2/9  ?Decreased Interest 0 0 0 0 0  ?Down, Depressed, Hopeless 0 0 0 0 0  ?PHQ - 2 Score 0 0 0 0 0  ?Altered sleeping 0 0 0    ?Tired, decreased energy 0 0 0    ?Change in appetite 0 0 0    ?Feeling bad or failure about yourself  0 0 0    ?Trouble concentrating 0 0 0    ?Moving slowly or  fidgety/restless 0 0 0    ?Suicidal thoughts 0 0 0    ?PHQ-9 Score 0 0 0    ?Difficult doing work/chores Not difficult at all Not difficult at all Not difficult at all    ?  ?phq 9 is {gen pos neg:315643} ?*** ? ?Fall Risk: ? ?  06/08/2021  ?  1:37 PM 09/18/2020  ?  3:01 PM 08/08/2019  ?  8:57 AM 06/21/2018  ? 11:53 AM 11/02/2017  ? 10:16 AM  ?Fall Risk   ?Falls in the past year? 1 0 0 0 No  ?Number falls in past yr: 1 0 0 0   ?Injury with Fall? 1 0 0 0   ?Risk for fall due to : History of fall(s)      ?Follow up Falls prevention discussed      ? ?*** ? ? ?Functional Status Survey: ?  ?*** ? ? ?Assessment & Plan ? ?*** ?There are no diagnoses linked to this encounter. ?

## 2021-09-01 NOTE — Progress Notes (Signed)
? ?Name: Caitlin Roberts   MRN: [id number]    DOB: Jun 29, 1995   Date:09/01/2021 ? ?     Progress Note ? ?Chief Complaint  ?Patient presents with  ? Follow-up  ? ? ? ?Subjective:  ? ?Caitlin Roberts is a 26 y.o. female, presents to clinic for f/up on fatigue and vit d deficiency and abnormal imaging ? ?Vit D - 10.6 ?On OTC supplement only ? ?Exhausted in general - with Vit D supplementation no significant improvement in sx ?Some weight changes ?Wt Readings from Last 5 Encounters:  ?09/01/21 170 lb 4.8 oz (77.2 kg)  ?06/08/21 175 lb 6.4 oz (79.6 kg)  ?05/25/21 173 lb (78.5 kg)  ?02/17/21 175 lb (79.4 kg)  ?09/18/20 172 lb (78 kg)  ? ?BMI Readings from Last 5 Encounters:  ?09/01/21 29.69 kg/m?  ?06/08/21 30.11 kg/m?  ?05/25/21 30.65 kg/m?  ?02/17/21 30.51 kg/m?  ?09/18/20 29.52 kg/m?  ? ?She had URI, went to UC, d-dimer was positive, then sent to ED- CT angio was neg for PE, but showed ground glass  ?She had improving sx after than, some steroids and abx ?F/up imaging since was clear (CXR) ?Reviewed visits, imaging and charts thoroughly today since she was seen by other providers in office ? ? ? ? ?Current Outpatient Medications:  ?  albuterol (VENTOLIN HFA) 108 (90 Base) MCG/ACT inhaler, TAKE 2 PUFFS BY MOUTH EVERY 6 HOURS AS NEEDED FOR WHEEZE OR SHORTNESS OF BREATH, Disp: 18 each, Rfl: 2 ?  cetirizine (ZYRTEC) 10 MG tablet, Take 10 mg by mouth daily., Disp: , Rfl:  ?  pantoprazole (PROTONIX) 20 MG tablet, Take 20 mg by mouth daily., Disp: , Rfl:  ?  pantoprazole (PROTONIX) 20 MG tablet, Take by mouth., Disp: , Rfl:  ? ?Current Facility-Administered Medications:  ?  etonogestrel (NEXPLANON) implant 68 mg, 68 mg, Subdermal, Once, Schuman, Christanna R, MD ? ?Patient Active Problem List  ? Diagnosis Date Noted  ? Mass of oropharynx 06/08/2021  ? Seasonal allergies 06/21/2018  ? Preventative health care 11/02/2017  ? Cervical cancer screening 01/20/2016  ? Family hx of colon cancer 01/20/2016  ? Encounter for screening for  HIV 01/20/2016  ? Family hx of colon cancer 01/20/2016  ? Annual physical exam 12/24/2014  ? Lesion of lower eyelid 12/24/2014  ? Bunion of great toe of left foot 12/24/2014  ? Bunion of great toe of right foot 12/24/2014  ? Allergic headache 12/24/2014  ? Influenza vaccination declined by patient 12/24/2014  ? Bumps on skin 12/24/2014  ? ? ?Past Surgical History:  ?Procedure Laterality Date  ? BUNIONECTOMY Left   ? about 70yr ago  ? BUNIONECTOMY Left   ? COLONOSCOPY WITH PROPOFOL N/A 02/17/2021  ? Procedure: COLONOSCOPY WITH PROPOFOL;  Surgeon: LLesly Rubenstein MD;  [facility];  Service: Endoscopy;  Laterality: N/A;  ? ? ?Family History  ?Problem Relation Age of Onset  ? Cancer Father   ?     colon  ? Kidney Stones Father   ? ? ?Social History  ? ?Tobacco Use  ? Smoking status: Never  ? Smokeless tobacco: Never  ?Vaping Use  ? Vaping Use: Never used  ?Substance Use Topics  ? Alcohol use: Yes  ?  Alcohol/week: 0.0 standard drinks  ?  Comment: occasionally  ? Drug use: No  ?  ? ?Allergies  ?Allergen Reactions  ? Pork-Derived Products Hives  ? ? ?Health Maintenance  ?Topic Date Due  ? COVID-19 Vaccine (4 - Booster for  Pfizer series) 09/17/2021 (Originally 08/29/2020)  ? INFLUENZA VACCINE  11/24/2021  ? PAP-Cervical Cytology Screening  03/25/2024  ? PAP SMEAR-Modifier  03/25/2024  ? COLONOSCOPY (Pts 45-31yr Insurance coverage will need to be confirmed)  02/17/2026  ? TETANUS/TDAP  04/27/2027  ? Hepatitis C Screening  Completed  ? HIV Screening  Completed  ? HPV VACCINES  Aged Out  ? ? ?Chart Review Today: ?I personally reviewed active problem list, medication list, allergies, family history, social history, health maintenance, notes from last encounter, lab results, imaging with the patient/caregiver today. ? ? ?Review of Systems  ?Constitutional: Negative.   ?HENT: Negative.    ?Eyes: Negative.   ?Respiratory: Negative.    ?Cardiovascular: Negative.   ?Gastrointestinal: Negative.   ?Endocrine:  Negative.   ?Genitourinary: Negative.   ?Musculoskeletal: Negative.   ?Skin: Negative.   ?Allergic/Immunologic: Negative.   ?Neurological: Negative.   ?Hematological: Negative.   ?Psychiatric/Behavioral: Negative.    ?All other systems reviewed and are negative.  ? ?Objective:  ? ?Vitals:  ? 09/01/21 0810  ?BP: 116/72  ?Pulse: 70  ?Resp: 14  ?Temp: 98.2 ?F (36.8 ?C)  ?TempSrc: Oral  ?SpO2: 99%  ?Weight: 170 lb 4.8 oz (77.2 kg)  ?Height: 5' 3.5" (1.613 m)  ? ? ?Body mass index is 29.69 kg/m?. ? ?Physical Exam ?Vitals and nursing note reviewed.  ?Constitutional:   ?   General: She is not in acute distress. ?   Appearance: Normal appearance. She is well-developed, well-groomed and overweight. She is not ill-appearing, toxic-appearing or diaphoretic.  ?HENT:  ?   Head: Normocephalic and atraumatic.  ?   Right Ear: External ear normal.  ?   Left Ear: External ear normal.  ?Eyes:  ?   General: Lids are normal. No scleral icterus.    ?   Right eye: No discharge.     ?   Left eye: No discharge.  ?   Conjunctiva/sclera: Conjunctivae normal.  ?Neck:  ?   Trachea: Phonation normal. No tracheal deviation.  ?Cardiovascular:  ?   Rate and Rhythm: Normal rate and regular rhythm.  ?   Pulses: Normal pulses.     ?     Radial pulses are 2+ on the right side and 2+ on the left side.  ?     Posterior tibial pulses are 2+ on the right side and 2+ on the left side.  ?   Heart sounds: Normal heart sounds. No murmur heard. ?  No friction rub. No gallop.  ?Pulmonary:  ?   Effort: Pulmonary effort is normal. No respiratory distress.  ?   Breath sounds: Normal breath sounds. No stridor. No wheezing, rhonchi or rales.  ?Chest:  ?   Chest wall: No tenderness.  ?Abdominal:  ?   General: Bowel sounds are normal. There is no distension.  ?   Palpations: Abdomen is soft.  ?Musculoskeletal:  ?   Right lower leg: No edema.  ?   Left lower leg: No edema.  ?Skin: ?   General: Skin is warm and dry.  ?   Coloration: Skin is not jaundiced or pale.  ?    Findings: No rash.  ?Neurological:  ?   Mental Status: She is alert.  ?   Motor: No abnormal muscle tone.  ?   Gait: Gait normal.  ?Psychiatric:     ?   Mood and Affect: Mood normal.     ?   Speech: Speech normal.     ?   Behavior:  Behavior normal. Behavior is cooperative.  ?  ? ? ? ? ?Assessment & Plan:  ? ?Problem List Items Addressed This Visit   ?None ?Visit Diagnoses   ? ? Fatigue, unspecified type    -  Primary  ? r/o anemia, hypothyroid  ? Relevant Orders  ? Comprehensive metabolic panel  ? TSH  ? T4, free  ? CBC with Differential/Platelet  ? Moderate mixed hyperlipidemia not requiring statin therapy      ? no need to recheck lipids with low risk, will monitor lfts, reviewed diet/lifestyle efforts for HLD  ? Relevant Orders  ? Comprehensive metabolic panel  ? Elevated LFTs      ? recheck, may have been viral, diet/weight, HLD - reviewed concerning lab ranges - will recheck with future labs  ? Relevant Orders  ? Comprehensive metabolic panel  ? Vitamin D deficiency      ? very low, not on Rx, but OTC, recheck levels - if not >20 then will send in high dose RX - pt not sure of current OTC dose  ? Relevant Orders  ? Comprehensive metabolic panel  ? VITAMIN D 25 Hydroxy (Vit-D Deficiency, Fractures)  ? ?  ?  ? ?Return in about 6 months (around 03/04/2022) for Routine follow-up.  ? ?Delsa Grana, PA-C ?09/01/21 8:45 AM ? ? ? ?

## 2021-09-08 ENCOUNTER — Ambulatory Visit: Payer: 59 | Admitting: Internal Medicine

## 2021-10-09 ENCOUNTER — Other Ambulatory Visit: Payer: Self-pay | Admitting: Family Medicine

## 2021-10-09 LAB — CBC WITH DIFFERENTIAL/PLATELET
Basophils Absolute: 0 10*3/uL (ref 0.0–0.2)
Basos: 1 %
EOS (ABSOLUTE): 0.1 10*3/uL (ref 0.0–0.4)
Eos: 2 %
Hematocrit: 43 % (ref 34.0–46.6)
Hemoglobin: 14.5 g/dL (ref 11.1–15.9)
Immature Grans (Abs): 0.1 10*3/uL (ref 0.0–0.1)
Immature Granulocytes: 1 %
Lymphocytes Absolute: 2.5 10*3/uL (ref 0.7–3.1)
Lymphs: 39 %
MCH: 29.8 pg (ref 26.6–33.0)
MCHC: 33.7 g/dL (ref 31.5–35.7)
MCV: 88 fL (ref 79–97)
Monocytes Absolute: 0.5 10*3/uL (ref 0.1–0.9)
Monocytes: 8 %
Neutrophils Absolute: 3.2 10*3/uL (ref 1.4–7.0)
Neutrophils: 49 %
Platelets: 342 10*3/uL (ref 150–450)
RBC: 4.87 x10E6/uL (ref 3.77–5.28)
RDW: 12.8 % (ref 11.7–15.4)
WBC: 6.4 10*3/uL (ref 3.4–10.8)

## 2021-10-09 LAB — COMPREHENSIVE METABOLIC PANEL
ALT: 27 IU/L (ref 0–32)
AST: 24 IU/L (ref 0–40)
Albumin/Globulin Ratio: 1.9 (ref 1.2–2.2)
Albumin: 4.4 g/dL (ref 3.9–5.0)
Alkaline Phosphatase: 72 IU/L (ref 44–121)
BUN/Creatinine Ratio: 20 (ref 9–23)
BUN: 12 mg/dL (ref 6–20)
Bilirubin Total: 0.3 mg/dL (ref 0.0–1.2)
CO2: 21 mmol/L (ref 20–29)
Calcium: 9.2 mg/dL (ref 8.7–10.2)
Chloride: 103 mmol/L (ref 96–106)
Creatinine, Ser: 0.61 mg/dL (ref 0.57–1.00)
Globulin, Total: 2.3 g/dL (ref 1.5–4.5)
Glucose: 91 mg/dL (ref 70–99)
Potassium: 4.2 mmol/L (ref 3.5–5.2)
Sodium: 136 mmol/L (ref 134–144)
Total Protein: 6.7 g/dL (ref 6.0–8.5)
eGFR: 126 mL/min/{1.73_m2} (ref >59)

## 2021-10-09 LAB — VITAMIN D 25 HYDROXY (VIT D DEFICIENCY, FRACTURES): Vit D, 25-Hydroxy: 15.7 ng/mL — ABNORMAL LOW (ref 30.0–100.0)

## 2021-10-09 LAB — TSH: TSH: 1.48 u[IU]/mL (ref 0.450–4.500)

## 2021-10-09 LAB — T4, FREE: Free T4: 1.08 ng/dL (ref 0.82–1.77)

## 2021-10-09 MED ORDER — VITAMIN D (ERGOCALCIFEROL) 1.25 MG (50000 UNIT) PO CAPS: 50000.0000 [IU] | ORAL_CAPSULE | ORAL | 0 refills | Status: DC

## 2021-11-22 ENCOUNTER — Other Ambulatory Visit: Payer: Self-pay | Admitting: Family Medicine

## 2022-03-04 ENCOUNTER — Ambulatory Visit: Payer: 59 | Admitting: Family Medicine

## 2022-03-04 ENCOUNTER — Encounter: Payer: Self-pay | Admitting: Family Medicine

## 2022-03-04 VITALS — BP 108/70 | HR 73 | Temp 98.5°F | Resp 16 | Ht 63.5 in | Wt 175.5 lb

## 2022-03-04 DIAGNOSIS — Z800 Family history of malignant neoplasm of digestive organs: Secondary | ICD-10-CM | POA: Diagnosis not present

## 2022-03-04 DIAGNOSIS — E559 Vitamin D deficiency, unspecified: Secondary | ICD-10-CM | POA: Insufficient documentation

## 2022-03-04 DIAGNOSIS — J4520 Mild intermittent asthma, uncomplicated: Secondary | ICD-10-CM

## 2022-03-04 DIAGNOSIS — E782 Mixed hyperlipidemia: Secondary | ICD-10-CM

## 2022-03-04 MED ORDER — VITAMIN D (ERGOCALCIFEROL) 1.25 MG (50000 UNIT) PO CAPS
50000.0000 [IU] | ORAL_CAPSULE | ORAL | 1 refills | Status: AC
Start: 2022-03-04 — End: ?

## 2022-03-04 NOTE — Assessment & Plan Note (Signed)
Intermittent sx, triggered by dust/pets    03/04/2022    2:44 PM  PUL ASTHMA HISTORY  Symptoms 0-2 days/week  Nighttime awakenings 0-2/month  Interference with activity No limitations  SABA use 0-2 days/wk  Exacerbations requiring oral steroids 0-1 / year  Asthma Severity Intermittent

## 2022-03-04 NOTE — Assessment & Plan Note (Addendum)
Dad at age 26 Reviewed her colonoscopy records and date that it is due next Put on AVS today No concerning sx, she notes changes in bowels or stool with different foods, no other concerns

## 2022-03-04 NOTE — Progress Notes (Signed)
Patient ID: Caitlin Roberts, female    DOB: [birth year], 26 y.o.   MRN: [id number]  PCP: Delsa Grana, PA-C  Chief Complaint  Patient presents with   Follow-up   Hyperlipidemia    Subjective:   Caitlin Roberts is a 26 y.o. female, presents to clinic for f/up on chronic conditions  Vit D deficiency - did Rx and currently doing OTC some days 2000? 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   Seasonal allergies 06/21/2018   Preventative health care 11/02/2017   Cervical cancer screening 01/20/2016   Family hx of colon cancer 01/20/2016   Encounter for screening for HIV 01/20/2016   Family hx of colon cancer 01/20/2016   Annual physical exam 12/24/2014   Lesion of lower eyelid 12/24/2014   Bunion of great toe of left foot 12/24/2014   Bunion of great toe of right foot 12/24/2014   Allergic headache 12/24/2014   Influenza vaccination declined by patient 12/24/2014   Bumps on skin 12/24/2014      Current Outpatient Medications:    albuterol (VENTOLIN HFA) 108 (90 Base) MCG/ACT inhaler, TAKE 2 PUFFS BY MOUTH EVERY 6 HOURS AS NEEDED FOR WHEEZE OR SHORTNESS OF BREATH, Disp: 18 each, Rfl: 2   cetirizine (ZYRTEC) 10 MG tablet, Take 10 mg by mouth daily., Disp: , Rfl:    Vitamin D, Ergocalciferol, (DRISDOL) 1.25 MG (50000 UNIT) CAPS capsule, Take 1 capsule (50,000 Units total) by mouth every 7 (seven) days. x12 weeks., Disp: 12 capsule, Rfl: 0   pantoprazole (PROTONIX) 20 MG tablet, Take 20 mg by mouth daily. (Patient not taking: Reported on 03/04/2022), Disp: , Rfl:   Current  Facility-Administered Medications:    etonogestrel (NEXPLANON) implant 68 mg, 68 mg, Subdermal, Once, Schuman, Christanna R, MD   Allergies  Allergen Reactions   Pork-Derived Products Hives     Social History   Tobacco Use   Smoking status: Never   Smokeless tobacco: Never  Vaping Use   Vaping Use: Never used  Substance Use Topics   Alcohol use: Yes    Alcohol/week: 0.0 standard drinks of alcohol    Comment: occasionally   Drug use: No      Chart Review Today: I personally reviewed active problem list, medication list, allergies, family history, social history, health maintenance, notes from last encounter, lab results, imaging with the patient/caregiver today.   Review of Systems  Constitutional: Negative.   HENT: Negative.    Eyes: Negative.   Respiratory: Negative.    Cardiovascular: Negative.   Gastrointestinal: Negative.   Endocrine: Negative.   Genitourinary: Negative.   Musculoskeletal: Negative.   Skin: Negative.   Allergic/Immunologic: Negative.   Neurological: Negative.   Hematological: Negative.   Psychiatric/Behavioral: Negative.    All other systems reviewed and are negative.      Objective:   Vitals:   03/04/22 0859  BP: 108/70  Pulse: 73  Resp: 16  Temp: 98.5 F (36.9 C)  TempSrc: Oral  SpO2: 96%  Weight: 175 lb 8 oz (79.6 kg)  Height: 5' 3.5" (  1.613 m)    Body mass index is 30.6 kg/m.  Physical Exam Vitals and nursing note reviewed.  Constitutional:      General: She is not in acute distress.    Appearance: Normal appearance. She is well-developed and normal weight. She is not ill-appearing, toxic-appearing or diaphoretic.  HENT:     Head: Normocephalic and atraumatic.     Nose: Nose normal.  Eyes:     General:        Right eye: No discharge.        Left eye: No discharge.     Conjunctiva/sclera: Conjunctivae normal.  Neck:     Trachea: No tracheal deviation.  Cardiovascular:     Rate and Rhythm: Normal rate and regular  rhythm.     Pulses: Normal pulses.     Heart sounds: Normal heart sounds.  Pulmonary:     Effort: Pulmonary effort is normal. No respiratory distress.     Breath sounds: Normal breath sounds. No stridor.  Musculoskeletal:        General: Normal range of motion.  Skin:    General: Skin is warm and dry.     Findings: No rash.  Neurological:     Mental Status: She is alert.     Motor: No abnormal muscle tone.     Coordination: Coordination normal.  Psychiatric:        Behavior: Behavior normal.      Results for orders placed or performed in visit on 09/01/21  Comprehensive metabolic panel  Result Value Ref Range   Glucose 91 70 - 99 mg/dL   BUN 12 6 - 20 mg/dL   Creatinine, Ser 0.61 0.57 - 1.00 mg/dL   eGFR 126 >59 mL/min/1.73   BUN/Creatinine Ratio 20 9 - 23   Sodium 136 134 - 144 mmol/L   Potassium 4.2 3.5 - 5.2 mmol/L   Chloride 103 96 - 106 mmol/L   CO2 21 20 - 29 mmol/L   Calcium 9.2 8.7 - 10.2 mg/dL   Total Protein 6.7 6.0 - 8.5 g/dL   Albumin 4.4 3.9 - 5.0 g/dL   Globulin, Total 2.3 1.5 - 4.5 g/dL   Albumin/Globulin Ratio 1.9 1.2 - 2.2   Bilirubin Total 0.3 0.0 - 1.2 mg/dL   Alkaline Phosphatase 72 44 - 121 IU/L   AST 24 0 - 40 IU/L   ALT 27 0 - 32 IU/L  TSH  Result Value Ref Range   TSH 1.480 0.450 - 4.500 uIU/mL  T4, free  Result Value Ref Range   Free T4 1.08 0.82 - 1.77 ng/dL  VITAMIN D 25 Hydroxy (Vit-D Deficiency, Fractures)  Result Value Ref Range   Vit D, 25-Hydroxy 15.7 (L) 30.0 - 100.0 ng/mL  CBC with Differential/Platelet  Result Value Ref Range   WBC 6.4 3.4 - 10.8 x10E3/uL   RBC 4.87 3.77 - 5.28 x10E6/uL   Hemoglobin 14.5 11.1 - 15.9 g/dL   Hematocrit 43.0 34.0 - 46.6 %   MCV 88 79 - 97 fL   MCH 29.8 26.6 - 33.0 pg   MCHC 33.7 31.5 - 35.7 g/dL   RDW 12.8 11.7 - 15.4 %   Platelets 342 150 - 450 x10E3/uL   Neutrophils 49 Not Estab. %   Lymphs 39 Not Estab. %   Monocytes 8 Not Estab. %   Eos 2 Not Estab. %   Basos 1 Not Estab. %    Neutrophils Absolute 3.2 1.4 - 7.0 x10E3/uL   Lymphocytes Absolute 2.5  0.7 - 3.1 x10E3/uL   Monocytes Absolute 0.5 0.1 - 0.9 x10E3/uL   EOS (ABSOLUTE) 0.1 0.0 - 0.4 x10E3/uL   Basophils Absolute 0.0 0.0 - 0.2 x10E3/uL   Immature Granulocytes 1 Not Estab. %   Immature Grans (Abs) 0.1 0.0 - 0.1 x10E3/uL       Assessment & Plan:   Problem List Items Addressed This Visit       Respiratory   Mild intermittent asthma without complication    Intermittent sx, triggered by dust/pets    03/04/2022    2:44 PM  PUL ASTHMA HISTORY  Symptoms 0-2 days/week  Nighttime awakenings 0-2/month  Interference with activity No limitations  SABA use 0-2 days/wk  Exacerbations requiring oral steroids 0-1 / year  Asthma Severity Intermittent           Other   Family hx of colon cancer    Dad at age 58 Reviewed her colonoscopy records and date that it is due next Put on AVS today No concerning sx, she notes changes in bowels or stool with different foods, no other concerns      Vitamin D deficiency - Primary    Last Vit D levels have been 10 and then 15, she took the Rx accidentally daily and then finished it about a month ago, she did note feeling good when taking it She hasn't taken OTC vit d regularly and doesn't feel as good Will recheck labs It may be appropriate to keep her on Rx high dose weekly or every other week Labs difficult to interpret with intermittent supplements and unknown doses      Relevant Medications   Vitamin D, Ergocalciferol, (DRISDOL) 1.25 MG (50000 UNIT) CAPS capsule   Other Relevant Orders   Comprehensive Metabolic Panel (CMET)   VITAMIN D 25 Hydroxy (Vit-D Deficiency, Fractures)        Delsa Grana, PA-C 03/04/22 9:29 AM

## 2022-03-04 NOTE — Assessment & Plan Note (Signed)
Last Vit D levels have been 10 and then 15, she took the Rx accidentally daily and then finished it about a month ago, she did note feeling good when taking it She hasn't taken OTC vit d regularly and doesn't feel as good Will recheck labs It may be appropriate to keep her on Rx high dose weekly or every other week Labs difficult to interpret with intermittent supplements and unknown doses

## 2022-03-04 NOTE — Patient Instructions (Signed)
Take the prescription vitamin D or try to take the over the counter more consistently and recheck your labs at [facility] Once we have your lab results and your current Vit D dosing we will help you adjust the dose needed going forward  If you feel the best on the prescription weekly dose you can do that and we can recheck the labs in 12 weeks and take it from there  Health Maintenance  Topic Date Due   COVID-19 Vaccine (4 - Pfizer series) 03/20/2022*   Pap Smear  03/25/2024   Pap Smear  03/25/2024   Colon Cancer Screening  02/17/2026   Tetanus Vaccine  04/27/2027   Flu Shot  Completed   Hepatitis C Screening: USPSTF Recommendation to screen - Ages 18-79 yo.  Completed   HIV Screening  Completed   HPV Vaccine  Aged Out  *Topic was postponed. The date shown is not the original due date.

## 2022-04-12 ENCOUNTER — Encounter: Payer: Self-pay | Admitting: Physician Assistant

## 2022-04-12 ENCOUNTER — Ambulatory Visit: Payer: 59 | Admitting: Physician Assistant

## 2022-04-12 VITALS — BP 104/66 | HR 110 | Temp 98.9°F | Resp 16 | Ht 63.5 in | Wt 177.7 lb

## 2022-04-12 DIAGNOSIS — J9801 Acute bronchospasm: Secondary | ICD-10-CM

## 2022-04-12 DIAGNOSIS — J4520 Mild intermittent asthma, uncomplicated: Secondary | ICD-10-CM | POA: Diagnosis not present

## 2022-04-12 MED ORDER — PREDNISONE 20 MG PO TABS: 40.0000 mg | ORAL_TABLET | Freq: Every day | ORAL | 0 refills | Status: AC

## 2022-04-12 MED ORDER — ALBUTEROL SULFATE HFA 108 (90 BASE) MCG/ACT IN AERS: 2.0000 | INHALATION_SPRAY | Freq: Four times a day (QID) | RESPIRATORY_TRACT | 2 refills | Status: DC | PRN

## 2022-04-12 MED ORDER — AZITHROMYCIN 250 MG PO TABS: ORAL_TABLET | ORAL | 0 refills | Status: DC

## 2022-04-12 NOTE — Patient Instructions (Addendum)
Based on your symptoms I think you are likely having a Bronchospasm that is causing you to cough a lot  This can be secondary to a viral illness, an asthma exacerbation or both   Symptoms can last for 3-10 days with lingering cough and intermittent symptoms lasting weeks after that.  The goal of treatment at this time is to reduce your symptoms and discomfort   I have sent in a Zpack to help with your symptoms- please take the two pills tonight and proceed with the instructions for subsequent days from there  I have sent in a Prednisone burst to further assist with the bronchospasms that I suspect are causing your cough - please start this tomorrow morning with breakfast and then proceed to take in the AM to avoid sleeplessness.   You can use over the counter medications such as Dayquil/Nyquil, AlkaSeltzer formulations, etc to provide further relief of symptoms according to the manufacturer's instructions  If preferred you can use Coricidin to manage your symptoms rather than those medications mentioned above.    If your symptoms do not improve or become worse in the next 5-7 days please make an apt at the office so we can see you  Go to the ER if you begin to have more serious symptoms such as shortness of breath, trouble breathing, loss of consciousness, swelling around the eyes, high fever, severe lasting headaches, vision changes or neck pain/stiffness.

## 2022-04-12 NOTE — Progress Notes (Signed)
Acute Office Visit   Patient: Caitlin Roberts   DOB: 1995/11/16   26 y.o. Female  MRN: [id number] Visit Date: 04/12/2022  Today's healthcare provider: Dani Gobble Sirron Francesconi, PA-C  Introduced myself to the patient as a Journalist, newspaper and provided education on APPs in clinical practice.    Chief Complaint  Patient presents with   Cough    On/off since the end of November, pt states if she coughs so much she feels tightness in her chest and difficulty breathing   Subjective    HPI HPI     Cough    Additional comments: On/off since the end of November, pt states if she coughs so much she feels tightness in her chest and difficulty breathing      Last edited by Salomon Fick, [place] on 04/12/2022  2:55 PM.        Reports she was tested on Dec 4th with respiratory panel for cough, and URI symptoms Reports everything was negative and she started to feel better Reports Sat she started to have cough again, headaches, some body aches, chills, subjective fever last night Reports productive cough last night with clear sputum but today has been dry  States today she has been coughing so hard that she has felt nauseous  She has been using her Albuterol inhaler about once per day at this time - reports some assistance with breathing last night but still had to prop herself up to sleep  Interventions: Rx cough syrup left over, theraflu COVID testing at home: none since the Dec 4th testing     Medications: Outpatient Medications Prior to Visit  Medication Sig   cetirizine (ZYRTEC) 10 MG tablet Take 10 mg by mouth daily.   pantoprazole (PROTONIX) 20 MG tablet Take 20 mg by mouth daily.   Vitamin D, Ergocalciferol, (DRISDOL) 1.25 MG (50000 UNIT) CAPS capsule Take 1 capsule (50,000 Units total) by mouth every 7 (seven) days. x12 weeks.   [DISCONTINUED] albuterol (VENTOLIN HFA) 108 (90 Base) MCG/ACT inhaler TAKE 2 PUFFS BY MOUTH EVERY 6 HOURS AS NEEDED FOR WHEEZE OR SHORTNESS OF BREATH    Facility-Administered Medications Prior to Visit  Medication Dose Route Frequency Provider   etonogestrel (NEXPLANON) implant 68 mg  68 mg Subdermal Once Schuman, Christanna R, MD    Review of Systems  Constitutional:  Positive for chills and fatigue. Negative for fever.  HENT:  Positive for congestion, postnasal drip and sinus pain. Negative for ear pain, rhinorrhea, sinus pressure and sore throat.   Respiratory:  Positive for cough and shortness of breath. Negative for wheezing.   Gastrointestinal:  Positive for nausea. Negative for diarrhea and vomiting.  Neurological:  Positive for headaches.       Objective    BP 104/66   Pulse (!) 110   Temp 98.9 F (37.2 C) (Oral)   Resp 16   Ht 5' 3.5" (1.613 m)   Wt 177 lb 11.2 oz (80.6 kg)   SpO2 99%   BMI 30.98 kg/m    Physical Exam Vitals reviewed.  Constitutional:      General: She is awake.     Appearance: Normal appearance. She is well-developed and well-groomed.  HENT:     Head: Normocephalic and atraumatic.  Cardiovascular:     Rate and Rhythm: Normal rate and regular rhythm.     Pulses: Normal pulses.          Radial pulses are 2+ on the right side  and 2+ on the left side.     Heart sounds: Normal heart sounds. No murmur heard.    No friction rub. No gallop.  Pulmonary:     Effort: Pulmonary effort is normal.     Breath sounds: Normal breath sounds. No decreased air movement. No decreased breath sounds, wheezing, rhonchi or rales.  Neurological:     Mental Status: She is alert.  Psychiatric:        Behavior: Behavior is cooperative.       No results found for any visits on 04/12/22.  Assessment & Plan      No follow-ups on file.      Problem List Items Addressed This Visit       Respiratory   Mild intermittent asthma without complication  Suspect mild exacerbation due to recent illness  Will provide refills of Albuterol inhaler along with Prednisone burst to assist with resolution Follow up as  needed    Relevant Medications   predniSONE (DELTASONE) 20 MG tablet   albuterol (VENTOLIN HFA) 108 (90 Base) MCG/ACT inhaler   Other Visit Diagnoses     Cough due to bronchospasm    -  Primary Acute, new concern Reports persistent dry cough that is not resolving despite home measures Will provide Prednisone burst and Zpack to assist with pulmonary inflammation and bronchospasm I have mild suspicion for acute asthma exacerbation but the above measures will benefit this as well Follow up as needed for persistent or progressing symptoms Can also use OTC theraflu to further assist with symptoms per preference.    Relevant Medications   predniSONE (DELTASONE) 20 MG tablet   azithromycin (ZITHROMAX) 250 MG tablet   Other Relevant Orders   Novel Coronavirus, NAA ([facility])        No follow-ups on file.   I, Kyren Knick E Ninel Abdella, PA-C, have reviewed all documentation for this visit. The documentation on 04/12/22 for the exam, diagnosis, procedures, and orders are all accurate and complete.   Talitha Givens, MHS, PA-C [facility]

## 2022-04-14 LAB — NOVEL CORONAVIRUS, NAA: SARS-CoV-2, NAA: NOT DETECTED

## 2022-09-06 ENCOUNTER — Encounter: Payer: 59 | Admitting: Family Medicine

## 2022-12-02 IMAGING — CR DG CHEST 2V
2 series · 2 of 2 positions shown · non-contrast
Comparison: CT 05/25/2021

CLINICAL DATA: 25-year-old female with finding on prior chest x-ray

EXAM:
CHEST - 2 VIEW

[chest pa]
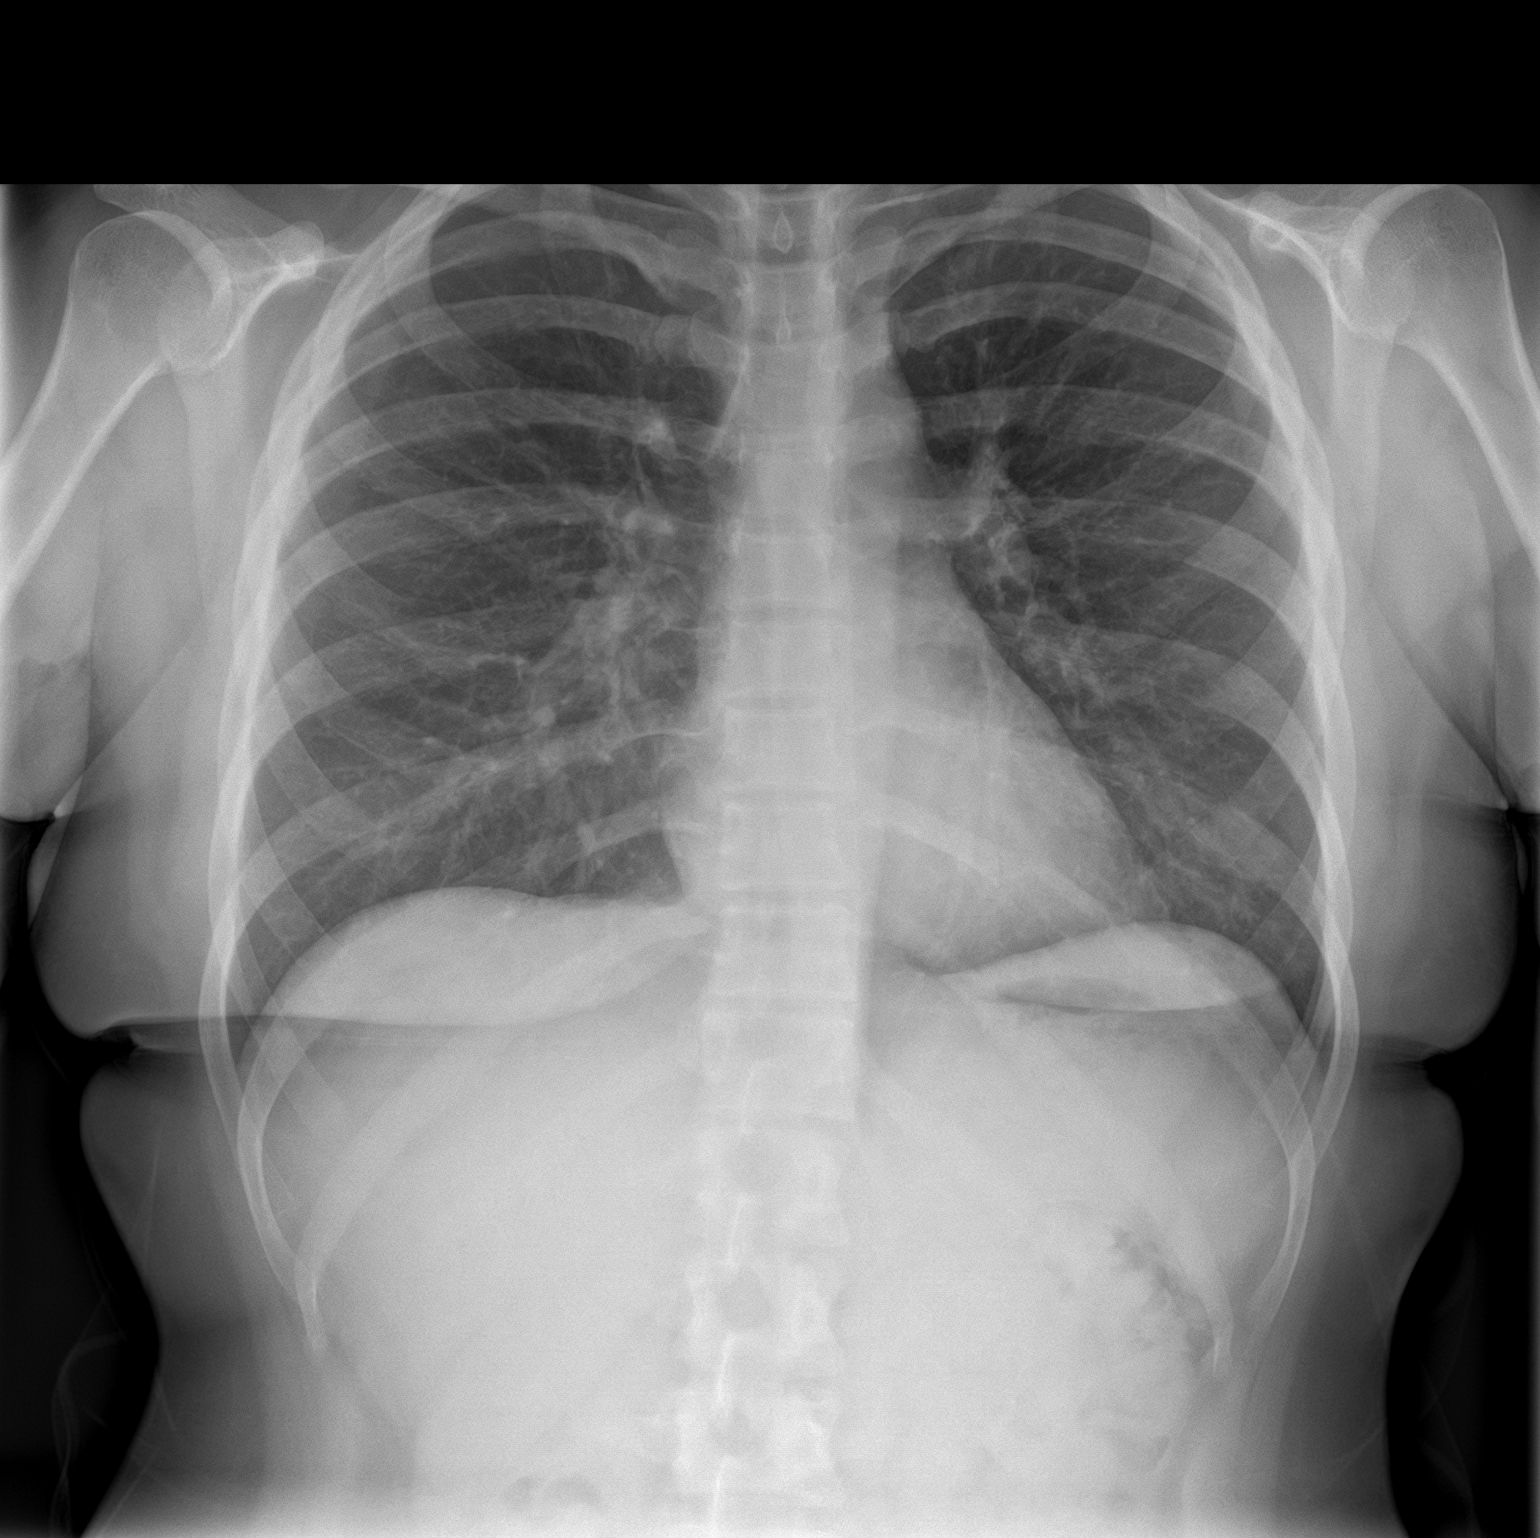

[chest lat]
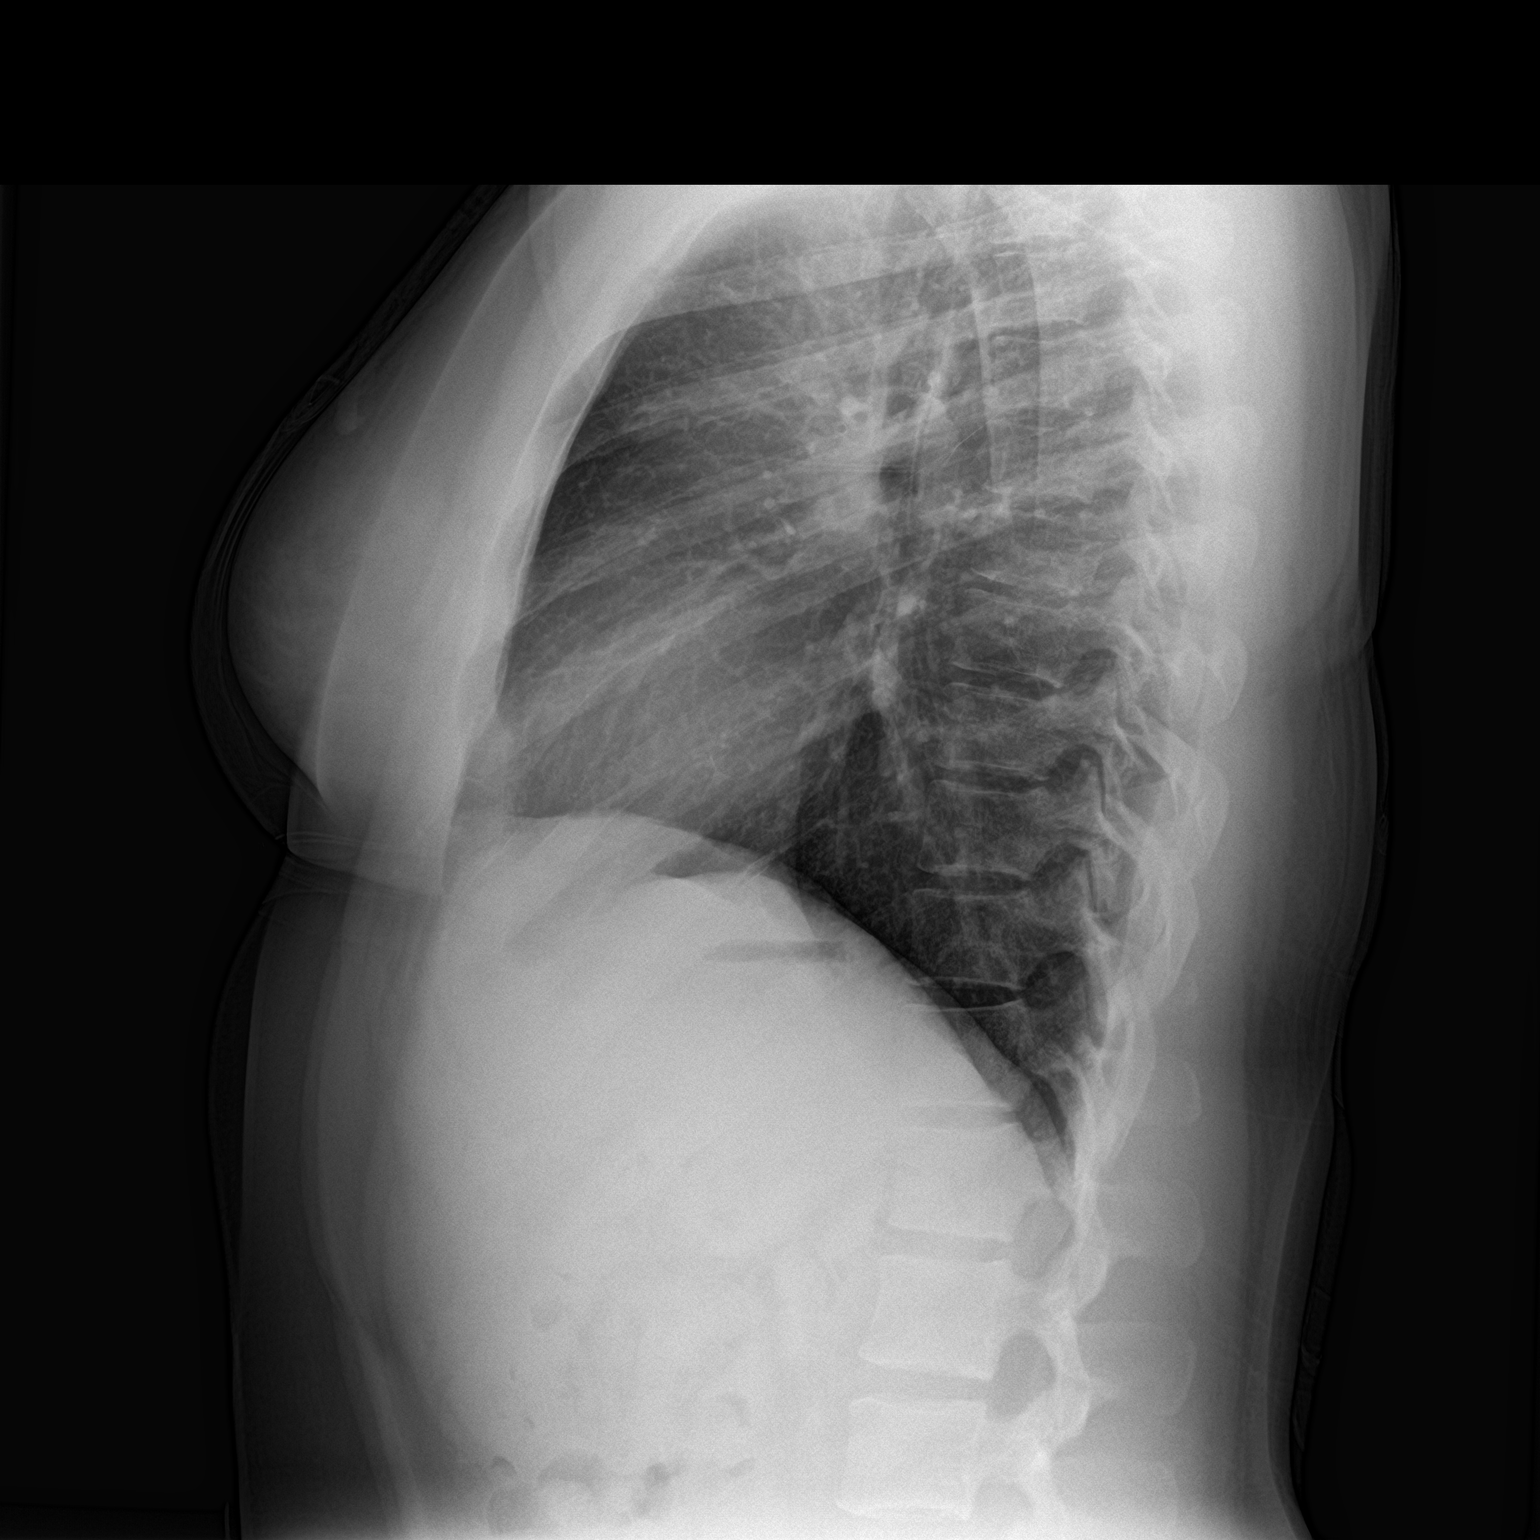

[2 of 2 positions shown; findings below may reference images not displayed]

FINDINGS: Cardiomediastinal silhouette within normal limits in size and
contour. No evidence of central vascular congestion. No interlobular
septal thickening. No pneumothorax or pleural effusion. No confluent
airspace disease.

No acute displaced fracture
IMPRESSION: No active cardiopulmonary disease.

## 2023-01-17 ENCOUNTER — Ambulatory Visit: Payer: 59 | Admitting: Physician Assistant

## 2023-01-17 ENCOUNTER — Encounter: Payer: Self-pay | Admitting: Physician Assistant

## 2023-01-17 VITALS — BP 102/64 | HR 102 | Temp 97.8°F | Resp 16 | Ht 63.5 in | Wt 180.0 lb

## 2023-01-17 DIAGNOSIS — J9801 Acute bronchospasm: Secondary | ICD-10-CM

## 2023-01-17 DIAGNOSIS — J4520 Mild intermittent asthma, uncomplicated: Secondary | ICD-10-CM

## 2023-01-17 MED ORDER — AZITHROMYCIN 250 MG PO TABS: ORAL_TABLET | ORAL | 0 refills | Status: DC

## 2023-01-17 MED ORDER — ALBUTEROL SULFATE HFA 108 (90 BASE) MCG/ACT IN AERS: 2.0000 | INHALATION_SPRAY | Freq: Four times a day (QID) | RESPIRATORY_TRACT | 0 refills | Status: AC | PRN

## 2023-01-17 MED ORDER — PREDNISONE 20 MG PO TABS: ORAL_TABLET | ORAL | 0 refills | Status: DC

## 2023-01-17 NOTE — Progress Notes (Signed)
Acute Office Visit   Patient: Caitlin Roberts   DOB: [birth year]   26 y.o. Female  MRN: [id number] Visit Date: 01/17/2023  Today's healthcare provider: Oswaldo Conroy Helana Macbride, PA-C  Introduced myself to the patient as a Secondary school teacher and provided education on APPs in clinical practice.    Chief Complaint  Patient presents with   Cough    X2 weeks started dry and now into chest.   Subjective    HPI HPI     Cough    Additional comments: X2 weeks started dry and now into chest.      Last edited by Forde Radon, CMA on 01/17/2023  3:41 PM.       Concern for Cough  Onset: gradual  She thinks her symptoms are overall lingering and staying the same  Duration: going into 3rd week  Associated symptoms: she initially had a dry cough but is now producing phlegm and having deeper coughs, has developed SOB and chest tightness with coughing  She reports she did have some sinus pain and pressure about a week ago but this has resolved  Interventions: Mucinex, Theraflu   Recent sick contacts: denies known sick contacts  Recent travel: none  COVID testing at home: She tested at home on Sat 01/08/23 and it was negative      Medications: Outpatient Medications Prior to Visit  Medication Sig   cetirizine (ZYRTEC) 10 MG tablet Take 10 mg by mouth daily.   pantoprazole (PROTONIX) 20 MG tablet Take 20 mg by mouth daily.   Vitamin D, Ergocalciferol, (DRISDOL) 1.25 MG (50000 UNIT) CAPS capsule Take 1 capsule (50,000 Units total) by mouth every 7 (seven) days. x12 weeks.   [DISCONTINUED] albuterol (VENTOLIN HFA) 108 (90 Base) MCG/ACT inhaler Inhale 2 puffs into the lungs every 6 (six) hours as needed for wheezing or shortness of breath.   [DISCONTINUED] azithromycin (ZITHROMAX) 250 MG tablet Take 500mg  PO daily x1d and then 250mg  daily x4 days   Facility-Administered Medications Prior to Visit  Medication Dose Route Frequency Provider   etonogestrel (NEXPLANON) implant 68 mg  68 mg  Subdermal Once Schuman, Christanna R, MD    Review of Systems  Constitutional:  Negative for chills and fever.  HENT:  Positive for congestion, postnasal drip, sinus pressure and sinus pain. Negative for ear pain, rhinorrhea and sore throat.   Respiratory:  Positive for cough, chest tightness, shortness of breath and wheezing.   Gastrointestinal:  Negative for diarrhea, nausea and vomiting.  Musculoskeletal:  Negative for myalgias.  Neurological:  Positive for headaches (with coughing fits). Negative for dizziness and light-headedness.        Objective    BP 102/64   Pulse (!) 102   Temp 97.8 F (36.6 C) (Oral)   Resp 16   Ht 5' 3.5" (1.613 m)   Wt 180 lb (81.6 kg)   SpO2 97%   BMI 31.39 kg/m     Physical Exam Vitals reviewed.  Constitutional:      General: She is awake.     Appearance: Normal appearance. She is well-developed and well-groomed.  HENT:     Head: Normocephalic and atraumatic.     Right Ear: Hearing, tympanic membrane and ear canal normal.     Left Ear: Hearing, tympanic membrane and ear canal normal.     Mouth/Throat:     Lips: Pink.     Mouth: Mucous membranes are moist.     Pharynx: No pharyngeal  swelling, oropharyngeal exudate, posterior oropharyngeal erythema, uvula swelling or postnasal drip.  Cardiovascular:     Rate and Rhythm: Normal rate and regular rhythm.     Pulses: Normal pulses.          Radial pulses are 2+ on the right side and 2+ on the left side.     Heart sounds: Normal heart sounds.  Pulmonary:     Effort: Pulmonary effort is normal.     Breath sounds: Normal breath sounds. No decreased air movement. No decreased breath sounds, wheezing, rhonchi or rales.  Musculoskeletal:     Cervical back: Normal range of motion and neck supple.     Right lower leg: No edema.     Left lower leg: No edema.  Lymphadenopathy:     Head:     Right side of head: No submental or submandibular adenopathy.     Left side of head: No submental or  submandibular adenopathy.     Cervical:     Right cervical: No superficial cervical adenopathy.    Left cervical: No superficial cervical adenopathy.     Upper Body:     Right upper body: No supraclavicular adenopathy.     Left upper body: No supraclavicular adenopathy.  Neurological:     Mental Status: She is alert.  Psychiatric:        Behavior: Behavior is cooperative.       No results found for any visits on 01/17/23.  Assessment & Plan      No follow-ups on file.     Problem List Items Addressed This Visit       Respiratory   Mild intermittent asthma without complication   Relevant Medications   predniSONE (DELTASONE) 20 MG tablet   albuterol (VENTOLIN HFA) 108 (90 Base) MCG/ACT inhaler   Other Visit Diagnoses     Cough due to bronchospasm    -  Primary Acute, recurrent concern Patient reports ongoing cough with nasal congestion and sinus pressure for the past 3 weeks that is not improving with home measures Suspect likely bronchospasm or asthma flare at this time Will provide refill of rescue inhaler along with prednisone taper and Zpack to assist with symptoms Recommend taking daily antihistamine as well to assist with symptoms Reviewed ED and return precautions Follow up as needed for persistent or progressing symptoms     Relevant Medications   predniSONE (DELTASONE) 20 MG tablet   albuterol (VENTOLIN HFA) 108 (90 Base) MCG/ACT inhaler   azithromycin (ZITHROMAX) 250 MG tablet        No follow-ups on file.   I, Ariston Grandison E Lavonta Tillis, PA-C, have reviewed all documentation for this visit. The documentation on 01/17/23 for the exam, diagnosis, procedures, and orders are all accurate and complete.   Jacquelin Hawking, MHS, PA-C [facility]

## 2023-06-27 ENCOUNTER — Ambulatory Visit: Payer: Self-pay | Admitting: Family Medicine

## 2023-06-27 ENCOUNTER — Ambulatory Visit: Admitting: Family Medicine

## 2023-06-27 ENCOUNTER — Encounter: Payer: Self-pay | Admitting: Family Medicine

## 2023-06-27 VITALS — BP 120/70 | HR 81 | Resp 16 | Ht 63.0 in | Wt 183.0 lb

## 2023-06-27 DIAGNOSIS — J45901 Unspecified asthma with (acute) exacerbation: Secondary | ICD-10-CM | POA: Diagnosis not present

## 2023-06-27 DIAGNOSIS — H6093 Unspecified otitis externa, bilateral: Secondary | ICD-10-CM | POA: Diagnosis not present

## 2023-06-27 DIAGNOSIS — T7840XA Allergy, unspecified, initial encounter: Secondary | ICD-10-CM | POA: Diagnosis not present

## 2023-06-27 DIAGNOSIS — R21 Rash and other nonspecific skin eruption: Secondary | ICD-10-CM

## 2023-06-27 MED ORDER — PREDNISONE 10 MG (21) PO TBPK: ORAL_TABLET | ORAL | 0 refills | Status: DC

## 2023-06-27 MED ORDER — LORATADINE 10 MG PO TABS: 10.0000 mg | ORAL_TABLET | Freq: Every day | ORAL | 1 refills | Status: AC

## 2023-06-27 MED ORDER — FAMOTIDINE 20 MG PO TABS: 20.0000 mg | ORAL_TABLET | Freq: Two times a day (BID) | ORAL | 0 refills | Status: AC | PRN

## 2023-06-27 MED ORDER — NEOMYCIN-POLYMYXIN-HC 3.5-10000-1 OT SOLN: 4.0000 [drp] | Freq: Four times a day (QID) | OTIC | 0 refills | Status: AC

## 2023-06-27 MED ORDER — EPINEPHRINE 0.3 MG/0.3ML IJ SOAJ: 0.3000 mg | Freq: Once | INTRAMUSCULAR | 1 refills | Status: AC | PRN

## 2023-06-27 NOTE — Progress Notes (Unsigned)
 Patient ID: Caitlin Roberts, female    DOB: 1995-05-07, 28 y.o.   MRN: [id number]  PCP: Danelle Berry, PA-C  Chief Complaint  Patient presents with   Facial Swelling    Eye and lip yesterday. Benadryl helped relieve.   Urticaria    Chin    Subjective:   Caitlin Roberts is a 28 y.o. female, presents to clinic with CC of the following:  HPI  Yesterday pt had eyes and lips swell up and hives to face She was not at home She did eat banana bread, but she's eaten it before w/o issues, no other known exposures  Started swelling above eyes and then to lips, then rash/hive across chin lower face No sore throat, SOB, wheeze, sensation of throat/airway closure She was around mold in home and has more asthma sx that normal No other new soaps, detergents, lotions, foods  She took 2 benadryl and sx improved Not other meds taken   Hx of similar rash/hives with UC visit many years ago she did consult with allergy specialists and did scratch test to her back - cannot see OV and insurance has changes an provider would not be in network She does want referral to specialists   Patient Active Problem List   Diagnosis Date Noted   Mild intermittent asthma without complication 03/04/2022   Vitamin D deficiency 03/04/2022   Seasonal allergies 06/21/2018   Family hx of colon cancer 01/20/2016      Current Outpatient Medications:    albuterol (VENTOLIN HFA) 108 (90 Base) MCG/ACT inhaler, Inhale 2 puffs into the lungs every 6 (six) hours as needed for wheezing or shortness of breath., Disp: 8 g, Rfl: 0   cetirizine (ZYRTEC) 10 MG tablet, Take 10 mg by mouth daily., Disp: , Rfl:    Vitamin D, Ergocalciferol, (DRISDOL) 1.25 MG (50000 UNIT) CAPS capsule, Take 1 capsule (50,000 Units total) by mouth every 7 (seven) days. x12 weeks., Disp: 12 capsule, Rfl: 1   azithromycin (ZITHROMAX) 250 MG tablet, Take 500mg  PO daily x1d and then 250mg  daily x4 days (Patient not taking: Reported on 06/27/2023), Disp:  6 each, Rfl: 0   pantoprazole (PROTONIX) 20 MG tablet, Take 20 mg by mouth daily. (Patient not taking: Reported on 06/27/2023), Disp: , Rfl:    predniSONE (DELTASONE) 20 MG tablet, Take 60mg  PO daily x 2 days, then40mg  PO daily x 2 days, then 20mg  PO daily x 3 days (Patient not taking: Reported on 06/27/2023), Disp: 13 tablet, Rfl: 0  Current Facility-Administered Medications:    etonogestrel (NEXPLANON) implant 68 mg, 68 mg, Subdermal, Once, Schuman, Christanna R, MD   Allergies  Allergen Reactions   Pork-Derived Products Hives     Social History   Tobacco Use   Smoking status: Never   Smokeless tobacco: Never  Vaping Use   Vaping status: Never Used  Substance Use Topics   Alcohol use: Yes    Alcohol/week: 0.0 standard drinks of alcohol    Comment: occasionally   Drug use: No      Chart Review Today: I personally reviewed active problem list, medication list, allergies, family history, social history, health maintenance, notes from last encounter, lab results, imaging with the patient/caregiver today.   Review of Systems  Constitutional: Negative.   HENT: Negative.    Eyes: Negative.   Respiratory: Negative.    Cardiovascular: Negative.   Gastrointestinal: Negative.   Endocrine: Negative.   Genitourinary: Negative.   Musculoskeletal: Negative.   Skin: Negative.  Allergic/Immunologic: Negative.   Neurological: Negative.   Hematological: Negative.   Psychiatric/Behavioral: Negative.    All other systems reviewed and are negative.      Objective:   Vitals:   06/27/23 1528  BP: 120/70  Pulse: 81  Resp: 16  SpO2: 97%  Weight: 183 lb (83 kg)  Height: 5\' 3"  (1.6 m)    Body mass index is 32.42 kg/m.  Physical Exam Vitals and nursing note reviewed.  Constitutional:      General: She is not in acute distress.    Appearance: Normal appearance. She is well-developed. She is not ill-appearing, toxic-appearing or diaphoretic.  HENT:     Head: Normocephalic and  atraumatic.     Comments: Normal lips, no swelling or rash    Right Ear: Tympanic membrane and external ear normal. There is no impacted cerumen.     Left Ear: Tympanic membrane and external ear normal. There is no impacted cerumen.     Ears:     Comments: Some bilateral canal erythema and mild edema, L>R, no drainage or exudate    Nose: Nose normal. No congestion or rhinorrhea.     Mouth/Throat:     Lips: Pink. No lesions.     Mouth: Mucous membranes are moist. No oral lesions or angioedema.     Pharynx: Oropharynx is clear. Uvula midline. No pharyngeal swelling, oropharyngeal exudate, posterior oropharyngeal erythema, uvula swelling or postnasal drip.     Tonsils: No tonsillar exudate.  Eyes:     General: No scleral icterus.       Right eye: No discharge.        Left eye: No discharge.     Conjunctiva/sclera: Conjunctivae normal.  Neck:     Trachea: Trachea and phonation normal. No tracheal deviation.  Cardiovascular:     Rate and Rhythm: Normal rate and regular rhythm.  Pulmonary:     Effort: Pulmonary effort is normal. No tachypnea, accessory muscle usage, respiratory distress or retractions.     Breath sounds: Normal breath sounds. No stridor, decreased air movement or transmitted upper airway sounds. No decreased breath sounds, wheezing, rhonchi or rales.  Musculoskeletal:     Cervical back: Normal range of motion.  Skin:    General: Skin is warm and dry.  Neurological:     Mental Status: She is alert.     Motor: No abnormal muscle tone.     Coordination: Coordination normal.  Psychiatric:        Behavior: Behavior normal.      Results for orders placed or performed in visit on 04/12/22  Novel Coronavirus, NAA ([facility])   Collection Time: 04/12/22  7:03 PM   Specimen: Nasopharyngeal(NP) swabs in vial transport medium   Nasopharynge  Previous  Result Value Ref Range   SARS-CoV-2, NAA Not Detected Not Detected       Assessment & Plan:       Allergic reaction,  initial encounter  EPINEPHrine (EPIPEN 2-PAK) 0.3 mg/0.3 mL IJ SOAJ injection  Swelling to eyes, lips and hive/rash to face Unknown cause Tx with 2nd generation antihistamine + pepcid, steroids for rxn but also mild asthma exacerbation F/up with specialists Epi pen indications, use and follow up care all reviewed with pt - hand out given and encouraged her also to do pharmacy consult as well    On exam today airway patent, no stridor or wheeze, no intraoral edema, VSS famotidine (PEPCID) 20 MG tablet loratadine (CLARITIN) 10 MG tablet Ambulatory referral to Allergy  2.  Rash and nonspecific skin eruption  famotidine (PEPCID) 20 MG tablet  rash and hives resolved, not noted on exam today Recommend 2nd generation antihistamine over benadryl use loratadine (CLARITIN) 10 MG tablet   Ambulatory referral to Allergy            3.  Asthma with acute exacerbation, unspecified asthma severity, unspecified whether persistent  predniSONE (STERAPRED UNI-PAK 21 TAB) 10 MG (21) TBPK tablet  Worse sx, especially night time since they tore up something in their home that was positive for mold Steroid burst, use inhaler as needed and f/up if sx persist Would like her to use aisupra or add ICS if needed Ambulatory referral to Allergy       4.  Otitis externa of both ears, unspecified chronicity, unspecified type  neomycin-polymyxin-hydrocortisone (CORTISPORIN) OTIC solution  Canals bilaterally erythematous and mildly edematous, she notes itching and cleaning/itching with q-tips - likely what has caused the inflammation.  Antihistamines may help, advised to stop q-tip use, can use drops - the steroids may be more helpful than the abx which I doubt she needs    F/up prn    Danelle Berry, PA-C 06/27/23 3:39 PM

## 2023-06-27 NOTE — Patient Instructions (Addendum)
 [facility] [address] [place], [place]  [address] [phone] (phone) [phone] (fax)  [facility] in [place]   Start the loratidine and famotidine tonight Start the steroid tomorrow morning  IF you have to use the epi pen call 911.

## 2023-06-27 NOTE — Telephone Encounter (Signed)
 Copied from CRM [id number]. Topic: Clinical - Red Word Triage >> Jun 27, 2023 11:06 AM Caitlin Roberts wrote: Red Word that prompted transfer to Nurse Triage: eyes swollen yesterday and this am Lip swollen yesterday, poss allergic reaction   Chief Complaint: Facial swelling Symptoms: Swelling of eyes, swelling of lips (resolved), itchiness, intermittent pain  Frequency: Constant  Pertinent Negatives: Patient denies fever, changes to detergents or lotions  Disposition: [] ED /[] Urgent Care (no appt availability in office) / [x] Appointment(In office/virtual)/ []  [facility]/ [] Home Care/ [] Refused Recommended Disposition /[] [facility]/ []  Follow-up with PCP Additional Notes: Patient reports that yesterday she woke up with swelling of her lips and eyes. She states that she took benadryl which improved her symptoms. She states that today she is still experiencing swelling of her eyes and would like to be examined. She states that she took an ibuprofen gel cap before the swelling began and states she is allergic to pork products. She states she has some difficulty breathing last night that resolved after using her inhaler. Appointment made for the patient today for evaluation.     Reason for Disposition  Face swelling began after taking a drug    Took Ibuprofen gel cap, possible cause  Answer Assessment - Initial Assessment Questions 1. ONSET: "When did the swelling start?" (e.g., minutes, hours, days)     Yesterday 2. LOCATION: "What part of the face is swollen?"     Eyes and lips yesterday, eyes today 3. SEVERITY: "How swollen is it?"     "They look droopy, right more than left" 4. ITCHING: "Is there any itching?" If Yes, ask: "How much?"   (Scale 1-10; mild, moderate or severe)     Intermittent itchiness  5. PAIN: "Is the swelling painful to touch?" If Yes, ask: "How painful is it?"   (Scale 1-10; mild, moderate or severe)   - NONE (0): no pain   - MILD (1-3): doesn't interfere  with normal activities    - MODERATE (4-7): interferes with normal activities or awakens from sleep    - SEVERE (8-10): excruciating pain, unable to do any normal activities      Pain upon waking, now resolved  6. FEVER: "Do you have a fever?" If Yes, ask: "What is it, how was it measured, and when did it start?"      No 7. CAUSE: "What do you think is causing the face swelling?"     Unsure, possible allergic reaction  8. RECURRENT SYMPTOM: "Have you had face swelling before?" If Yes, ask: "When was the last time?" "What happened that time?"     No  9. OTHER SYMPTOMS: "Do you have any other symptoms?" (e.g., toothache, leg swelling)     Headache, shortness of breath last night that is now resolved  10. PREGNANCY: "Is there any chance you are pregnant?" "When was your last menstrual period?"       No  Protocols used: Face Swelling-A-AH

## 2023-06-27 NOTE — Telephone Encounter (Unsigned)
 Copied from CRM [id number]. Topic: Referral - Status >> Jun 27, 2023  4:25 PM Franchot Heidelberg wrote: Reason for CRM: Pt called back to provide the allergy specialist that is in network for her.  [facility] Phone: [phone]  Fax: [phone]

## 2023-06-28 MED ORDER — AIRSUPRA 90-80 MCG/ACT IN AERO: 2.0000 | INHALATION_SPRAY | RESPIRATORY_TRACT | 2 refills | Status: AC | PRN

## 2023-08-26 ENCOUNTER — Other Ambulatory Visit (HOSPITAL_COMMUNITY)
Admission: RE | Admit: 2023-08-26 | Discharge: 2023-08-26 | Disposition: A | Discharge status: Home / Self Care | Source: Ambulatory Visit | Attending: Family Medicine | Admitting: Family Medicine

## 2023-08-26 ENCOUNTER — Encounter: Payer: Self-pay | Admitting: Family Medicine

## 2023-08-26 ENCOUNTER — Ambulatory Visit: Admitting: Family Medicine

## 2023-08-26 VITALS — BP 122/74 | HR 92 | Resp 16 | Ht 63.0 in | Wt 180.0 lb

## 2023-08-26 DIAGNOSIS — N898 Other specified noninflammatory disorders of vagina: Secondary | ICD-10-CM

## 2023-08-26 MED ORDER — FLUCONAZOLE 150 MG PO TABS: 150.0000 mg | ORAL_TABLET | ORAL | 0 refills | Status: DC | PRN

## 2023-08-26 NOTE — Patient Instructions (Addendum)
 Your cervical cancer screening looks due this year November - follow up with OBGYN or schedule your complete physical here and we can do your screening/pap/HPV   You can start the diflucan now to treat for possible yeast infection We will call you probably Monday with the test results and let you know at that time if any other treatments are recommended. You may also try over the counter miconzole external creams for genital/vaginal itching if you have outer symptoms return (see monistat boxes and generic equivalent so you can see the suppository and cream ingredients)   Irritation of the Vagina (Vaginitis): What to Know  Vaginitis is irritation and swelling of the vagina. It happens when the usual balance of bacteria and yeast in the vagina changes. This change causes some types to grow too much. This overgrowth leads to vaginitis. What are the causes? Bacteria. Yeast, which is a fungus. A parasite. A virus. Low hormones in the body. This can occur during pregnancy, breastfeeding, or after menopause. What increases the risk? Irritants, such as douches, bubble baths, scented tampons, and feminine sprays. Antibiotics. Poor hygiene. Wearing tight pants or thong underwear. Some birth control methods, such as diaphragms, vaginal sponges, or spermicides. Having sex without a condom or having sex with more than one person. Infections. Uncontrolled diabetes. What are the signs or symptoms? Abnormal fluid from the vagina. The fluid may be: White, gray, or yellow. Thick, white, and cheesy. Frothy and yellow or green. A bad smell from the vagina. Itching, pain, or swelling in the vagina. Pain during sex. Pain or burning when you pee. How is this diagnosed? This condition is diagnosed based on your symptoms, medical history, and an exam. This may include a pelvic exam. Tests may also be done. Tests may be done to: Check the pH level of your vagina. Check the fluid in your vagina. How is  this treated? Treatment will depend on what is causing your vaginitis. Treatment may include: Antibiotics. Antifungal medicines. Medicines to treat symptoms if you have a virus. Your sex partner should also be treated. Estrogen medicines. Medicines to treat allergies. The medicines may be pills or creams. Follow these instructions at home: Lifestyle Keep the area around your vagina clean and dry. Avoid using soap. Rinse the area with water. Until your health care provider says it's okay: Do not douche. Do not use tampons. Use pads, if needed. Do not have sex. Wipe from front to back after going to the bathroom. When the provider says it's okay, practice safe sex. Use condoms. General instructions Take your medicines only as told. If you were given antibiotics, take them as told. Do not stop taking them even if you start to feel better. How is this prevented? Use mild, unscented products. Avoid the following products if they are scented: Sprays. Detergents. Tampons. Products for cleaning the vagina. Soaps or bubble baths. Let air reach your genital area. To do this: Wear cotton underwear. Do not wear underwear while you sleep. Do not wear tight pants and underwear or pantyhose without a cotton panel. Do not wear thong underwear. Take off any wet clothing, such as bathing suits, as soon as possible. Practice safe sex. Use condoms. Contact a health care provider if: You have pain in the belly or around the pelvis. You have a fever or chills. You have symptoms that last for more than 2-3 days. This information is not intended to replace advice given to you by your health care provider. Make sure you discuss any  questions you have with your health care provider. Document Revised: 01/13/2023 Document Reviewed: 08/23/2022 Elsevier Patient Education  2024 [employer].

## 2023-08-26 NOTE — Progress Notes (Signed)
 Patient ID: Caitlin Roberts, female    DOB: 06/05/1995, 28 y.o.   MRN: [id number]  PCP: Adeline Hone, PA-C  Chief Complaint  Patient presents with   Vaginal Itching    X3 days    Subjective:   Caitlin Roberts is a 28 y.o. female, presents to clinic with CC of the following:  HPI  Itchy vulvovaginal area with tampon LMP 08/21/23, itching and discomfort with tampon x 3 d Last pap at GYN 02/2021 -  No new exposure, no urinary sx, denies abd pain, discharge, rash/lesions, has not tried anything OTC    Patient Active Problem List   Diagnosis Date Noted   Mild intermittent asthma without complication 03/04/2022   Vitamin D  deficiency 03/04/2022   Seasonal allergies 06/21/2018   Family hx of colon cancer 01/20/2016      Current Outpatient Medications:    albuterol  (VENTOLIN  HFA) 108 (90 Base) MCG/ACT inhaler, Inhale 2 puffs into the lungs every 6 (six) hours as needed for wheezing or shortness of breath., Disp: 8 g, Rfl: 0   Albuterol -Budesonide (AIRSUPRA ) 90-80 MCG/ACT AERO, Inhale 2 puffs into the lungs every 4 (four) hours as needed (asthma sx, wheeze, cough, shortness of breath, chest tightness)., Disp: 10.7 g, Rfl: 2   cetirizine (ZYRTEC) 10 MG tablet, Take 10 mg by mouth daily., Disp: , Rfl:    EPINEPHrine  (EPIPEN  2-PAK) 0.3 mg/0.3 mL IJ SOAJ injection, Inject 0.3 mg into the muscle once as needed (for severe allergic reaction). CAll 911 immediately if you have to use this medicine, Disp: 2 each, Rfl: 1   famotidine  (PEPCID ) 20 MG tablet, Take 1 tablet (20 mg total) by mouth 2 (two) times daily as needed for heartburn or indigestion., Disp: 60 tablet, Rfl: 0   loratadine  (CLARITIN ) 10 MG tablet, Take 1 tablet (10 mg total) by mouth daily., Disp: 30 tablet, Rfl: 1   Vitamin D , Ergocalciferol , (DRISDOL ) 1.25 MG (50000 UNIT) CAPS capsule, Take 1 capsule (50,000 Units total) by mouth every 7 (seven) days. x12 weeks., Disp: 12 capsule, Rfl: 1   pantoprazole (PROTONIX) 20 MG tablet,  Take 20 mg by mouth daily. (Patient not taking: Reported on 08/26/2023), Disp: , Rfl:   Current Facility-Administered Medications:    etonogestrel  (NEXPLANON ) implant 68 mg, 68 mg, Subdermal, Once, Schuman, Christanna R, MD   Allergies  Allergen Reactions   Pork-Derived Products Hives     Social History   Tobacco Use   Smoking status: Never   Smokeless tobacco: Never  Vaping Use   Vaping status: Never Used  Substance Use Topics   Alcohol use: Yes    Alcohol/week: 0.0 standard drinks of alcohol    Comment: occasionally   Drug use: No      Chart Review Today: I personally reviewed active problem list, medication list, allergies, family history, social history, health maintenance, notes from last encounter, lab results, imaging with the patient/caregiver today.   Review of Systems  Constitutional: Negative.   HENT: Negative.    Eyes: Negative.   Respiratory: Negative.    Cardiovascular: Negative.   Gastrointestinal: Negative.   Endocrine: Negative.   Genitourinary: Negative.   Musculoskeletal: Negative.   Skin: Negative.   Allergic/Immunologic: Negative.   Neurological: Negative.   Hematological: Negative.   Psychiatric/Behavioral: Negative.    All other systems reviewed and are negative.      Objective:   Vitals:   08/26/23 1400  BP: 122/74  Pulse: 92  Resp: 16  SpO2: 98%  Weight:  180 lb (81.6 kg)  Height: 5\' 3"  (1.6 m)    Body mass index is 31.89 kg/m.  Physical Exam Vitals and nursing note reviewed.  Constitutional:      General: She is not in acute distress.    Appearance: Normal appearance. She is well-developed. She is obese. She is not ill-appearing, toxic-appearing or diaphoretic.  HENT:     Head: Normocephalic and atraumatic.     Nose: Nose normal.  Eyes:     General:        Right eye: No discharge.        Left eye: No discharge.     Conjunctiva/sclera: Conjunctivae normal.  Neck:     Trachea: No tracheal deviation.  Cardiovascular:      Rate and Rhythm: Normal rate and regular rhythm.  Pulmonary:     Effort: Pulmonary effort is normal. No respiratory distress.     Breath sounds: Normal breath sounds. No stridor.  Abdominal:     General: Bowel sounds are normal. There is no distension.     Palpations: Abdomen is soft.     Tenderness: There is no abdominal tenderness. There is no right CVA tenderness or left CVA tenderness.  Musculoskeletal:        General: Normal range of motion.  Skin:    General: Skin is warm and dry.     Findings: No rash.  Neurological:     Mental Status: She is alert.     Motor: No abnormal muscle tone.     Coordination: Coordination normal.  Psychiatric:        Behavior: Behavior normal.      Results for orders placed or performed in visit on 04/12/22  Novel Coronavirus, NAA ([facility])   Collection Time: 04/12/22  7:03 PM   Specimen: Nasopharyngeal(NP) swabs in vial transport medium   Nasopharynge  Previous  Result Value Ref Range   SARS-CoV-2, NAA Not Detected Not Detected       Assessment & Plan:      ICD-10-CM   1. Vaginal itching  N89.8 Cervicovaginal ancillary only    fluconazole (DIFLUCAN) 150 MG tablet    Possibly could be related to tampon/contact irritation or wrong size, vaginal swab done today, pt can try diflucan and OTC topical antifungals, we will f/up with her in 2-3 d with results     Adeline Hone, PA-C 08/26/23 2:14 PM

## 2023-08-30 ENCOUNTER — Encounter: Payer: Self-pay | Admitting: Family Medicine

## 2023-08-30 LAB — CERVICOVAGINAL ANCILLARY ONLY
Bacterial Vaginitis (gardnerella): NEGATIVE
Candida Glabrata: NEGATIVE
Candida Vaginitis: POSITIVE — AB
Chlamydia: NEGATIVE
Comment: NEGATIVE
Comment: NEGATIVE
Comment: NEGATIVE
Comment: NEGATIVE
Comment: NEGATIVE
Comment: NORMAL
Neisseria Gonorrhea: NEGATIVE
Trichomonas: NEGATIVE

## 2024-04-25 ENCOUNTER — Ambulatory Visit: Admitting: Family Medicine

## 2024-04-25 ENCOUNTER — Encounter: Payer: Self-pay | Admitting: Family Medicine

## 2024-04-25 VITALS — BP 120/76 | HR 83 | Resp 16 | Ht 63.0 in | Wt 186.0 lb

## 2024-04-25 DIAGNOSIS — R102 Pelvic and perineal pain unspecified side: Secondary | ICD-10-CM

## 2024-04-25 DIAGNOSIS — Z131 Encounter for screening for diabetes mellitus: Secondary | ICD-10-CM

## 2024-04-25 DIAGNOSIS — E785 Hyperlipidemia, unspecified: Secondary | ICD-10-CM

## 2024-04-25 LAB — POCT URINALYSIS DIPSTICK
Appearance: NORMAL
Bilirubin, UA: NEGATIVE
Glucose, UA: NEGATIVE
Ketones, UA: NEGATIVE
Leukocytes, UA: NEGATIVE
Nitrite, UA: NEGATIVE
Protein, UA: NEGATIVE
Spec Grav, UA: 1.025
Urobilinogen, UA: 0.2 U/dL
pH, UA: 6.5

## 2024-04-25 NOTE — Progress Notes (Signed)
 "  Acute Office Visit  Subjective:     Patient ID: Caitlin Roberts, female    DOB: [birth year], 28 y.o.   MRN: [id number]  Chief Complaint  Patient presents with   Groin Pain    L side, happens randomly since October. Is currently on period.    HPI Patient is in today for complaints of pelvic pain. She describes pelvic pain to be left sided. She reports symptoms started in October. She describes pain as episodic sharp pains. She voices symptoms initially were infrequent. She states one episode in October and she was sitting at her desk and developed the sharp pain, and pain was resolved within a few seconds. She had one episode in November and similar lasting only a few seconds. She voices this past week she had an episode of pain that lasted from Sunday extending into Monday. She voices the pain began as a sharp pain, then developing more of achy pain, but ultimately went back to a sharp pain. She denies nausea or vomiting with episodes of pain most recently, but in November she did have nausea with the pain. She denies constipation or diarrhea. She voices most recent episode of pain was prior to onset of her menstrual period, but the other two episodes were not associated with her menstrual period. She is currently on her menstrual period starting two days ago. She does have a GYN that she follows with. Pap UTD, last pap 09/2023. She voices normally menstrual periods are regular, however most recently menstrual periods have not been every 28 days. She voices menstrual periods are still once monthly but can range from 25 to 34 days.    She also requests that insulin resistance is checked because she is worried this is contributing to darkening of skin and increased skin tags. She was screened for diabetes in June and would like to be re screened.   Review of Systems  Constitutional:  Negative for fever.  Gastrointestinal:  Positive for nausea. Negative for constipation, diarrhea and vomiting.   Genitourinary:  Negative for dysuria, frequency and urgency.        Objective:    BP 120/76   Pulse 83   Resp 16   Ht 5' 3 (1.6 m)   Wt 186 lb (84.4 kg)   LMP 04/25/2024   SpO2 98%   BMI 32.95 kg/m    Physical Exam Constitutional:      Appearance: Normal appearance.  Cardiovascular:     Rate and Rhythm: Normal rate and regular rhythm.     Heart sounds: Normal heart sounds.  Pulmonary:     Effort: Pulmonary effort is normal. No respiratory distress.     Breath sounds: Normal breath sounds.  Abdominal:     General: Abdomen is flat. Bowel sounds are normal.     Palpations: Abdomen is soft.     Tenderness: There is no abdominal tenderness.  Skin:    General: Skin is warm and dry.  Neurological:     General: No focal deficit present.     Mental Status: She is alert.  Psychiatric:        Mood and Affect: Mood normal.        Behavior: Behavior normal.        Assessment & Plan:   Assessment & Plan Pelvic pain She voices concerns of pelvic pain. Pelvic pain as per HPI.   On exam abdomen is non tender to palpation and bowel sounds are normal. She demonstrates by pointing that  pain is more localized to the pubic region and left sided groin pain. Hematuria seen on in house urine dipstick. Suspect that presence of blood is s/t menstrual period but will send out urine culture.    -Labs are obtained, including urine culture  -CT abdomen and pelvis with contrast ordered -1 month f/u planned Orders:   POCT Urinalysis Dipstick   Urine Culture   CT ABDOMEN PELVIS W CONTRAST; Future   CBC w/Diff/Platelet   Comprehensive Metabolic Panel (CMET)  Dyslipidemia Labs obtained earlier this year, 09/2023, with GYN and pertinent findings include elevated LDL of 151 and total cholesterol of 218. Dyslipidemia uncontrolled.  -Recommended dietary changes to manage dyslipidemia -Update labs -Return in 1 month to review lab results  Orders:   NMR, lipoprofile  Screening for  diabetes mellitus She requests screening for diabetes. She is concerned about potential diabetes and/or insulin resistance due to darkening of the skin on the back of her neck and increase in presence of skin tags.  Orders:   HgB A1c     Return in about 1 month (around 05/26/2024).  LAYMON LOISE CORE, FNP   "

## 2024-04-27 ENCOUNTER — Ambulatory Visit: Payer: Self-pay | Admitting: Family Medicine

## 2024-04-27 LAB — NMR, LIPOPROFILE
Cholesterol, Total: 248 mg/dL — ABNORMAL HIGH (ref 100–199)
HDL Particle Number: 30.4 umol/L — ABNORMAL LOW
HDL-C: 44 mg/dL
LDL Particle Number: 2097 nmol/L — ABNORMAL HIGH
LDL Size: 20.7 nm
LDL-C (NIH Calc): 168 mg/dL — ABNORMAL HIGH (ref 0–99)
LP-IR Score: 72 — ABNORMAL HIGH
Small LDL Particle Number: 1124 nmol/L — ABNORMAL HIGH
Triglycerides: 196 mg/dL — ABNORMAL HIGH (ref 0–149)

## 2024-04-27 LAB — CBC WITH DIFFERENTIAL/PLATELET
Basophils Absolute: 0.1 x10E3/uL (ref 0.0–0.2)
Basos: 1 %
EOS (ABSOLUTE): 0.4 x10E3/uL (ref 0.0–0.4)
Eos: 5 %
Hematocrit: 43.9 % (ref 34.0–46.6)
Hemoglobin: 14.7 g/dL (ref 11.1–15.9)
Immature Grans (Abs): 0 x10E3/uL (ref 0.0–0.1)
Immature Granulocytes: 0 %
Lymphocytes Absolute: 2.5 x10E3/uL (ref 0.7–3.1)
Lymphs: 33 %
MCH: 30.3 pg (ref 26.6–33.0)
MCHC: 33.5 g/dL (ref 31.5–35.7)
MCV: 91 fL (ref 79–97)
Monocytes Absolute: 0.6 x10E3/uL (ref 0.1–0.9)
Monocytes: 7 %
Neutrophils Absolute: 3.9 x10E3/uL (ref 1.4–7.0)
Neutrophils: 53 %
Platelets: 367 x10E3/uL (ref 150–450)
RBC: 4.85 x10E6/uL (ref 3.77–5.28)
RDW: 12.7 % (ref 11.7–15.4)
WBC: 7.4 x10E3/uL (ref 3.4–10.8)

## 2024-04-27 LAB — COMPREHENSIVE METABOLIC PANEL WITH GFR
ALT: 29 IU/L (ref 0–32)
AST: 21 IU/L (ref 0–40)
Albumin: 4.5 g/dL (ref 4.0–5.0)
Alkaline Phosphatase: 84 IU/L (ref 41–116)
BUN/Creatinine Ratio: 18 (ref 9–23)
BUN: 13 mg/dL (ref 6–20)
Bilirubin Total: 0.3 mg/dL (ref 0.0–1.2)
CO2: 23 mmol/L (ref 20–29)
Calcium: 9.2 mg/dL (ref 8.7–10.2)
Chloride: 105 mmol/L (ref 96–106)
Creatinine, Ser: 0.72 mg/dL (ref 0.57–1.00)
Globulin, Total: 2.4 g/dL (ref 1.5–4.5)
Glucose: 86 mg/dL (ref 70–99)
Potassium: 4.2 mmol/L (ref 3.5–5.2)
Sodium: 140 mmol/L (ref 134–144)
Total Protein: 6.9 g/dL (ref 6.0–8.5)
eGFR: 115 mL/min/1.73

## 2024-04-27 LAB — HEMOGLOBIN A1C
Est. average glucose Bld gHb Est-mCnc: 108 mg/dL
Hgb A1c MFr Bld: 5.4 % (ref 4.8–5.6)

## 2024-04-28 LAB — URINE CULTURE

## 2024-04-28 LAB — SPECIMEN STATUS REPORT

## 2024-05-01 ENCOUNTER — Other Ambulatory Visit: Payer: Self-pay

## 2024-05-01 DIAGNOSIS — N898 Other specified noninflammatory disorders of vagina: Secondary | ICD-10-CM

## 2024-05-01 DIAGNOSIS — R102 Pelvic and perineal pain unspecified side: Secondary | ICD-10-CM

## 2024-05-07 ENCOUNTER — Telehealth: Payer: Self-pay

## 2024-05-07 NOTE — Telephone Encounter (Signed)
 Copied from CRM #[id number]. Topic: Clinical - Request for Lab/Test Order >> May 07, 2024  8:38 AM Delon HERO wrote: Reason for CRM: Patient is calling to request order to be faxed to [facility] Gillermina Hurst at [facility]  [address] [facility] [address] [place], [place] [address] - Order was intially place via [facility] 04/25/24. Imaging stating that they have not received it. Patient is request order to be faxed to [phone]. Phone- [phone].  Please advise with  the patient when order is completed  Patient is calling reporting that she has [employer]. Advised that she is not in network with [facility]. Patient is calling to ask if insurance is not in network how much does she need to pay? Please advise

## 2024-05-29 ENCOUNTER — Ambulatory Visit: Admitting: Family Medicine

## 2024-06-04 ENCOUNTER — Encounter: Payer: Self-pay | Admitting: Internal Medicine

## 2024-07-27 ENCOUNTER — Encounter: Payer: Self-pay | Admitting: Internal Medicine
